# Patient Record
Sex: Male | Born: 1970 | Race: White | Hispanic: No | Marital: Married | Smoking: Never smoker
Health system: Southern US, Community
[De-identification: ages and names within clinical notes are randomized; demographics above are authoritative.]

## PROBLEM LIST (undated history)

## (undated) DIAGNOSIS — G4730 Sleep apnea, unspecified: Secondary | ICD-10-CM

## (undated) DIAGNOSIS — T7840XA Allergy, unspecified, initial encounter: Secondary | ICD-10-CM

## (undated) DIAGNOSIS — K219 Gastro-esophageal reflux disease without esophagitis: Secondary | ICD-10-CM

## (undated) HISTORY — DX: Allergy, unspecified, initial encounter: T78.40XA

## (undated) HISTORY — PX: COLONOSCOPY: SHX174

## (undated) HISTORY — DX: Gastro-esophageal reflux disease without esophagitis: K21.9

## (undated) HISTORY — PX: POLYPECTOMY: SHX149

## (undated) HISTORY — DX: Sleep apnea, unspecified: G47.30

---

## 1989-06-23 HISTORY — PX: SKULL FRACTURE ELEVATION: SHX781

## 2009-03-07 ENCOUNTER — Ambulatory Visit: Payer: Self-pay | Admitting: Internal Medicine

## 2009-03-07 DIAGNOSIS — R9431 Abnormal electrocardiogram [ECG] [EKG]: Secondary | ICD-10-CM

## 2009-03-07 DIAGNOSIS — J309 Allergic rhinitis, unspecified: Secondary | ICD-10-CM | POA: Insufficient documentation

## 2009-03-08 LAB — CONVERTED CEMR LAB
Glucose, Bld: 91 mg/dL (ref 70–99)
LDL Cholesterol: 141 mg/dL — ABNORMAL HIGH (ref 0–99)
Total CHOL/HDL Ratio: 6
VLDL: 23.8 mg/dL (ref 0.0–40.0)

## 2010-03-18 ENCOUNTER — Ambulatory Visit: Payer: Self-pay | Admitting: Internal Medicine

## 2010-03-18 DIAGNOSIS — L723 Sebaceous cyst: Secondary | ICD-10-CM

## 2010-07-23 NOTE — Assessment & Plan Note (Signed)
Summary: MASSES UNDER ARMPIT/DLO   Vital Signs:  Patient profile:   40 year old male Weight:      175 pounds BMI:     25.75 Temp:     98.1 degrees F oral Pulse rate:   76 / minute Pulse rhythm:   regular BP sitting:   112 / 76  (left arm) Cuff size:   regular  Vitals Entered By: Sydell Axon LPN (March 18, 2010 10:07 AM) CC: Check masses under right armpit   History of Present Illness: Has a couple of lumps in right armpit One is higher and sore, one is lower and not sore started about 5-7 days ago  No breast masses or discharge No scratches or injury to right arm Doesn't have a cat  Did change deodorant---did notice the changes after that (only used for a couple of days)  No drainage  Did try warm compress and seemed to give temporary relief  Allergies: No Known Drug Allergies  Past History:  Past medical, surgical, family and social histories (including risk factors) reviewed for relevance to current acute and chronic problems.  Past Medical History: Reviewed history from 03/07/2009 and no changes required. Allergic rhinitis  Past Surgical History: Reviewed history from 03/07/2009 and no changes required. Depressed skull fracture--needed repair  1991 Chest pain admit--stress test negative   ~2005  Family History: Reviewed history from 03/07/2009 and no changes required. Doesn't know Dad's history Mom in good health 1 brother, 2 sisters No CAD, HTN DM in maternal GM No prostate or colon cancer Mat GF with some skin cancers  Social History: Reviewed history from 03/07/2009 and no changes required. 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   Breasts:  no masses, no swelling, no nipple discharge, and no tenderness.   Skin:  2 small  subcutaneous masses in right axilla clearly not nodes no sig inflammation Axillary Nodes:  No palpable lymphadenopathy   Impression & Recommendations:  Problem # 1:  SEBACEOUS CYST (ICD-706.2) Assessment New from sweat gland probaby due to brief use of stick deodorant discussed warm compresses  Rx for keflex in case they become inflamed  Complete Medication List: 1)  Zyrtec Allergy 10 Mg Tbdp (Cetirizine hcl) .... Take one by mouth daily as needed 2)  Cephalexin 500 Mg Tabs (Cephalexin) .Marland Kitchen.. 1 tab by mouth three times a day for infection under arm  Patient Instructions: 1)  Please schedule a follow-up appointment as needed .  Prescriptions: CEPHALEXIN 500 MG TABS (CEPHALEXIN) 1 tab by mouth three times a day for infection under arm  #30 x 0   Entered and Authorized by:   Cindee Salt MD   Signed by:   Cindee Salt MD on 03/18/2010   Method used:   Print then Give to Patient   RxID:   [id number]   Current Allergies (reviewed today): No known allergies

## 2011-01-24 ENCOUNTER — Ambulatory Visit (INDEPENDENT_AMBULATORY_CARE_PROVIDER_SITE_OTHER): Payer: Self-pay | Admitting: Family Medicine

## 2011-01-24 ENCOUNTER — Encounter: Payer: Self-pay | Admitting: Family Medicine

## 2011-01-24 VITALS — BP 120/80 | HR 61 | Temp 98.3°F | Wt 183.2 lb

## 2011-01-24 DIAGNOSIS — M549 Dorsalgia, unspecified: Secondary | ICD-10-CM | POA: Insufficient documentation

## 2011-01-24 MED ORDER — CYCLOBENZAPRINE HCL 10 MG PO TABS: 5.0000 mg | ORAL_TABLET | Freq: Three times a day (TID) | ORAL | Status: AC | PRN

## 2011-01-24 NOTE — Progress Notes (Signed)
Back pain.  Sharp pain in L back after standing last night.  Still with pain this AM.  He gets a sharp pain episodically.  No trauma/fall.  He was doing a lot of work this past week, but no sig change until last night.  "Stabbing/shooting pain".  Uncomfortable last night.  No FCNAVD.  No leg sx, no radicular sx.  No leg weakness.    Meds, vitals, and allergies reviewed.   ROS: See HPI.  Otherwise, noncontributory.  nad ncat Back w/o midline pain. L paraspinal muscles ttp, pain with facet loading.  Distally nv intact w/o radicular sx.

## 2011-01-24 NOTE — Patient Instructions (Signed)
Use the flexeril up to 3 times a day.  It can make you drowsy.  Take ibuprofen 200mg  tabs, 3 tabs 3-4 times a day with food.  Gently stretch your back and use a heating pad.  Take care.

## 2011-01-25 NOTE — Assessment & Plan Note (Signed)
Likely facet irritation and/or muscle spasm.  D/w pt about gentle stretching, flexeril with sedation caution and NSAIDS with GI caution.  No indication to image, d/w pt.  F/u prn.  No red flags on exam or history.

## 2012-03-22 ENCOUNTER — Ambulatory Visit (INDEPENDENT_AMBULATORY_CARE_PROVIDER_SITE_OTHER): Payer: BC Managed Care – PPO | Admitting: Internal Medicine

## 2012-03-22 ENCOUNTER — Encounter: Payer: Self-pay | Admitting: Internal Medicine

## 2012-03-22 VITALS — BP 118/70 | HR 56 | Temp 98.2°F | Ht 69.0 in | Wt 171.0 lb

## 2012-03-22 DIAGNOSIS — R5383 Other fatigue: Secondary | ICD-10-CM

## 2012-03-22 DIAGNOSIS — Z23 Encounter for immunization: Secondary | ICD-10-CM

## 2012-03-22 DIAGNOSIS — Z0000 Encounter for general adult medical examination without abnormal findings: Secondary | ICD-10-CM | POA: Insufficient documentation

## 2012-03-22 DIAGNOSIS — R5381 Other malaise: Secondary | ICD-10-CM

## 2012-03-22 LAB — HEPATIC FUNCTION PANEL
ALT: 23 U/L (ref 0–53)
Alkaline Phosphatase: 67 U/L (ref 39–117)
Bilirubin, Direct: 0.1 mg/dL (ref 0.0–0.3)
Total Bilirubin: 1 mg/dL (ref 0.3–1.2)
Total Protein: 6.9 g/dL (ref 6.0–8.3)

## 2012-03-22 LAB — BASIC METABOLIC PANEL
Calcium: 9.2 mg/dL (ref 8.4–10.5)
GFR: 76.59 mL/min (ref >60.00)
Sodium: 139 mEq/L (ref 135–145)

## 2012-03-22 LAB — CBC WITH DIFFERENTIAL/PLATELET
Basophils Relative: 0.4 % (ref 0.0–3.0)
Hemoglobin: 15.3 g/dL (ref 13.0–17.0)
Lymphocytes Relative: 29.7 % (ref 12.0–46.0)
Monocytes Relative: 4.2 % (ref 3.0–12.0)
Neutro Abs: 3.5 10*3/uL (ref 1.4–7.7)
RBC: 5.09 Mil/uL (ref 4.22–5.81)
WBC: 5.5 10*3/uL (ref 4.5–10.5)

## 2012-03-22 LAB — SEDIMENTATION RATE: Sed Rate: 4 mm/hr (ref 0–22)

## 2012-03-22 NOTE — Assessment & Plan Note (Signed)
Healthy Counseling done 

## 2012-03-22 NOTE — Progress Notes (Signed)
Subjective:    Patient ID: Miguel Jackson, male    DOB: 1970/12/05, 41 y.o.   MRN: [id number]  HPI Here for physical  Concerned about diabetes-- feels tired more Goes back a year or more Notes "crash" about 1 hour after eating Nods off easily Feels that he sleeps okay Does snore some but no reports of gasping or apnea Usually awakens refreshed---before alarm goes off Better if he is active Tries bike regularly---mountain biking. Runs with daughter at times  No current outpatient prescriptions on file prior to visit.    No Known Allergies  Past Medical History  Diagnosis Date  . Allergy     Past Surgical History  Procedure Date  . Skull fracture elevation 1991    depressed, needed repair    Family History  Problem Relation Age of Onset  . Diabetes Maternal Grandmother   . Cancer Maternal Grandfather     skin    History   Social History  . Marital Status: Married    Spouse Name: N/A    Number of Children: 2  . Years of Education: N/A   Occupational History  . IT     [employer]   Social History Main Topics  . Smoking status: Never Smoker   . Smokeless tobacco: Never Used  . Alcohol Use: Yes  . Drug Use: Not on file  . Sexually Active: Not on file   Other Topics Concern  . Not on file   Social History Narrative       Review of Systems  Constitutional: Positive for fatigue. Negative for unexpected weight change.       Wears seat belt  HENT: Positive for congestion and rhinorrhea. Negative for hearing loss, dental problem and tinnitus.        Uses cetirizine prn Due for dental appt  Eyes: Negative for visual disturbance.       No diplopia or unilateral vision loss  Respiratory: Negative for cough, chest tightness and shortness of breath.   Cardiovascular: Negative for chest pain, palpitations and leg swelling.  Gastrointestinal: Negative for nausea, vomiting, abdominal pain, constipation and blood in stool.       No heartburn    Genitourinary: Negative for urgency, frequency and difficulty urinating.       Rare dribbling ---seems positional No sexual problems  Musculoskeletal: Negative for back pain, joint swelling and arthralgias.  Skin: Negative for rash.       No suspicious areas  Neurological: Positive for headaches. Negative for dizziness, syncope, weakness, light-headedness and numbness.       Occ headaches with changes in barometric pressure---occ uses tylenol or ibuprofen  Hematological: Negative for adenopathy. Does not bruise/bleed easily.  Psychiatric/Behavioral: Negative for disturbed wake/sleep cycle and dysphoric mood. The patient is not nervous/anxious.        Objective:   Physical Exam  Constitutional: He is oriented to person, place, and time. He appears well-developed and well-nourished. No distress.  HENT:  Head: Normocephalic and atraumatic.  Right Ear: External ear normal.  Left Ear: External ear normal.  Mouth/Throat: Oropharynx is clear and moist. No oropharyngeal exudate.  Eyes: Conjunctivae normal and EOM are normal. Pupils are equal, round, and reactive to light.  Neck: Normal range of motion. Neck supple. No thyromegaly present.  Cardiovascular: Normal rate, regular rhythm, normal heart sounds and intact distal pulses.  Exam reveals no gallop.   No murmur heard. Pulmonary/Chest: Effort normal and breath sounds normal. No respiratory distress. He has no wheezes. He  has no rales.  Abdominal: Soft. Bowel sounds are normal. He exhibits no distension. There is no tenderness. There is no rebound.  Musculoskeletal: Normal range of motion. He exhibits no edema and no tenderness.  Lymphadenopathy:    He has no cervical adenopathy.    He has no axillary adenopathy.       Right: No inguinal, no supraclavicular and no epitrochlear adenopathy present.       Left: No inguinal, no supraclavicular and no epitrochlear adenopathy present.  Neurological: He is alert and oriented to person, place,  and time.  Skin: No rash noted. No erythema.  Psychiatric: He has a normal mood and affect. His behavior is normal. Thought content normal.          Assessment & Plan:

## 2012-03-22 NOTE — Assessment & Plan Note (Signed)
Vague feeling of "crash" some time post prandial  No findings on PE and no other worrisome findings from history Will check labs Doubt he has serious pathology

## 2012-03-22 NOTE — Addendum Note (Signed)
Addended by: Sueanne Margarita on: 03/22/2012 12:36 PM   Modules accepted: Orders

## 2012-09-13 ENCOUNTER — Encounter: Payer: Self-pay | Admitting: Family Medicine

## 2012-09-13 ENCOUNTER — Ambulatory Visit (INDEPENDENT_AMBULATORY_CARE_PROVIDER_SITE_OTHER)
Admission: RE | Admit: 2012-09-13 | Discharge: 2012-09-13 | Disposition: A | Discharge status: Home / Self Care | Payer: Worker's Compensation | Source: Ambulatory Visit | Attending: Family Medicine | Admitting: Family Medicine

## 2012-09-13 ENCOUNTER — Ambulatory Visit (INDEPENDENT_AMBULATORY_CARE_PROVIDER_SITE_OTHER): Payer: Worker's Compensation | Admitting: Family Medicine

## 2012-09-13 ENCOUNTER — Ambulatory Visit: Admission: RE | Admit: 2012-09-13 | Payer: Self-pay | Source: Ambulatory Visit

## 2012-09-13 ENCOUNTER — Ambulatory Visit: Payer: Self-pay

## 2012-09-13 VITALS — BP 138/88 | HR 66 | Temp 98.2°F | Wt 186.2 lb

## 2012-09-13 DIAGNOSIS — M25529 Pain in unspecified elbow: Secondary | ICD-10-CM

## 2012-09-13 DIAGNOSIS — M7712 Lateral epicondylitis, left elbow: Secondary | ICD-10-CM

## 2012-09-13 DIAGNOSIS — M25522 Pain in left elbow: Secondary | ICD-10-CM

## 2012-09-13 DIAGNOSIS — M7710 Lateral epicondylitis, unspecified elbow: Secondary | ICD-10-CM

## 2012-09-13 NOTE — Progress Notes (Signed)
Nature conservation officer at [facility] [address] [place] [address] Phone: [phone] Fax: [phone]  Date:  09/13/2012   Name:  Miguel Jackson   DOB:  [birth year]   MRN:  [id number] Gender: male Age: 42 y.o.  Primary Physician:  Tillman Abide, MD  Evaluating MD: Hannah Beat, MD   Chief Complaint: L elbow pain   History of Present Illness:  Miguel Jackson is a 42 y.o. pleasant patient who presents with the following:  Was at work 1 month ago, and was working on a piece of gear, and hit his L elbow pretty hard on the latch of the door. Thought might leave a bruise. Was about a month ago, and was having some shooting pains. Has had a fair amount continued pain. Has some constant dull feeling in his hand. Pain with supination and extension at the wrist on the L.  Flex hand, will felt some pain and discomfort in the area of trauma.  RHD.  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   Patient Active Problem List  Diagnosis  . ALLERGIC RHINITIS  . Routine general medical examination at a health care facility  . Fatigue    Past Medical History  Diagnosis Date  . Allergy     Past Surgical History  Procedure Laterality Date  . Skull fracture elevation  1991    depressed, needed repair    History   Social History  . Marital Status: Married    Spouse Name: N/A    Number of Children: 2  . Years of Education: N/A   Occupational History  . IT     [employer]   Social History Main Topics  . Smoking status: Never Smoker   . Smokeless tobacco: Never Used  . Alcohol Use: Yes  . Drug Use: Not on file  . Sexually Active: Not on file   Other Topics Concern  . Not on file   Social History Narrative        Family History  Problem Relation Age of Onset  . Diabetes Maternal Grandmother   . Cancer Maternal Grandfather     skin    No Known Allergies  Medication list has been reviewed and updated.  No outpatient prescriptions  prior to visit.   No facility-administered medications prior to visit.    Review of Systems:   GEN: No fevers, chills. Nontoxic. Primarily MSK c/o today. MSK: Detailed in the HPI GI: tolerating PO intake without difficulty Neuro: No numbness, parasthesias, or tingling associated. Otherwise the pertinent positives of the ROS are noted above.    Physical Examination: BP 138/88  Pulse 66  Temp(Src) 98.2 F (36.8 C) (Oral)  Wt 186 lb 4 oz (84.482 kg)  BMI 27.49 kg/m2  SpO2 97%  Ideal Body Weight:     GEN: WDWN, NAD, Non-toxic, Alert & Oriented x 3 HEENT: Atraumatic, Normocephalic.  Ears and Nose: No external deformity. EXTR: No clubbing/cyanosis/edema NEURO: Normal gait.  PSYCH: Normally interactive. Conversant. Not depressed or anxious appearing.  Calm demeanor.   Elbow: LEFT Ecchymosis or edema: neg ROM: EXT: lacks 2 degrees Flexion: lacks 10 deg, painful terminally Supination and pronation: terminal motion normal Shoulder ROM: Full Flexion: 5/5 Extension: 5/5, PAINFUL Supination: 5/5, PAINFUL Pronation: 5/5 Wrist ext: 5/5 Wrist flexion: 5/5 No gross bony abnormality Varus and Valgus stress: stable ECRB tenderness: YES, TTP Medial epicondyle: NT Lateral epicondyle, resisted wrist extension from wrist full pronation and flexion: PAINFUL grip: 5/5  sensation intact Tinel's,  Elbow: negative   Dg Elbow Complete Left  09/13/2012  *RADIOLOGY REPORT*  Clinical Data: Left elbow pain.  LEFT ELBOW - COMPLETE 3+ VIEW  Comparison: None.  Findings: No acute bony abnormality.  Specifically, no fracture, subluxation, or dislocation.  Soft tissues are intact. Joint spaces are maintained.  Normal bone mineralization.  No joint effusion.  IMPRESSION: No acute bony abnormality.   Original Report Authenticated By: Charlett Nose, M.D.     Assessment and Plan:  Left elbow pain - Plan: DG Elbow Complete Left, Ambulatory referral to Physical Therapy  Tennis elbow syndrome, left -  Plan: Ambulatory referral to Physical Therapy  Traumatic lateral epicondylitis on the LEFT Probable traumatic irritation of the LEFT radial nerve Loss of motion at the elbow - likely from guarding. Normal xrays. Seems unlikely but remotely possible that a fracture occurred at time of injury 1 month ago.  PT to help resume normal ROM, which is critically important, and use ATYMM  Or GRASHEY technique. Iontophoresis at ECRB, also.   Recheck 5-6 weeks.  Orders Today:  Orders Placed This Encounter  Procedures  . DG Elbow Complete Left    Standing Status: Future     Number of Occurrences: 1     Standing Expiration Date: 11/13/2013    Order Specific Question:  Preferred imaging location?    Answer:  [facility]    Order Specific Question:  Reason for exam:    Answer:  trauma left elbow, 1 month ago  . Ambulatory referral to Physical Therapy    Referral Priority:  Routine    Referral Type:  Physical Medicine    Referral Reason:  Specialty Services Required    Requested Specialty:  Physical Therapy    Number of Visits Requested:  1    Updated Medication List: (Includes new medications, updates to list, dose adjustments) No orders of the defined types were placed in this encounter.    Medications Discontinued: There are no discontinued medications.    Signed, Elpidio Galea. Vernell Back, MD 09/13/2012 8:46 AM

## 2012-09-13 NOTE — Patient Instructions (Signed)
F/u 5-6 weeks

## 2013-04-28 ENCOUNTER — Other Ambulatory Visit: Payer: Self-pay

## 2013-11-29 ENCOUNTER — Ambulatory Visit (INDEPENDENT_AMBULATORY_CARE_PROVIDER_SITE_OTHER): Payer: BC Managed Care – PPO | Admitting: Family Medicine

## 2013-11-29 ENCOUNTER — Encounter: Payer: Self-pay | Admitting: Family Medicine

## 2013-11-29 VITALS — BP 120/70 | HR 68 | Temp 98.3°F | Ht 68.5 in | Wt 184.5 lb

## 2013-11-29 DIAGNOSIS — R000 Tachycardia, unspecified: Secondary | ICD-10-CM

## 2013-11-29 NOTE — Progress Notes (Signed)
   [place] [address] Phone: [phone] Fax: [phone]  Patient ID: Miguel Jackson MRN: [id number], DOB: 07/03/1970, 43 y.o. Date of Encounter: 11/29/2013  Primary Physician:  Viviana Simpler, MD   Chief Complaint: Heart Murmur   Subjective:   History of Present Illness:  Miguel Jackson is a 43 y.o. very pleasant male patient who presents with the following:  10 years ago and went to an EKG, and thought was abnormal and had a bunch of tests. Cardiologists and went through a bunch of tests.   Stress test and imaging, but EKG was normal. Since then, no problems. Had an EKG about 10 years ago.   [employer] a lot. Over the last few weeks, and was sitting in a chair, and could hear what sounded and was not in time with his breathing. In tune with his heartbeat. Has kept his eyes on it, and sometimes on bed. Hear a whooshing or funny sound.   Past Medical History, Surgical History, Social History, Family History, Problem List, Medications, and Allergies have been reviewed and updated if relevant.  Review of Systems:  GEN: No acute illnesses, no fevers, chills. GI: No n/v/d, eating normally Pulm: No SOB Interactive and getting along well at home.  Otherwise, ROS is as per the HPI.  Objective:   Physical Examination: BP 120/70  Pulse 68  Temp(Src) 98.3 F (36.8 C) (Oral)  Ht 5' 8.5" (1.74 m)  Wt 184 lb 8 oz (83.689 kg)  BMI 27.64 kg/m2  SpO2 96%   GEN: WDWN, NAD, Non-toxic, A & O x 3 HEENT: Atraumatic, Normocephalic. Neck supple. No masses, No LAD. Ears and Nose: No external deformity. CV: RRR, No M/G/R. No JVD. No thrill. No extra heart sounds. PULM: CTA B, no wheezes, crackles, rhonchi. No retractions. No resp. distress. No accessory muscle use. EXTR: No c/c/e NEURO Normal gait.  PSYCH: Normally interactive. Conversant. Not depressed or anxious appearing.  Calm demeanor.   Laboratory and Imaging Data:  Assessment & Plan:    Tachycardia - Plan: EKG 12-Lead  I think there is no murmur. EKG normal. Reassured.  EKG: Normal sinus rhythm. Normal axis, normal R wave progression, No acute ST elevation or depression.   New Prescriptions   No medications on file   Modified Medications   No medications on file   Orders Placed This Encounter  Procedures  . EKG 12-Lead   Follow-up: No Follow-up on file. Unless noted above, the patient is to follow-up if symptoms worsen. Red flags were reviewed with the patient.  Signed,  Maud Deed. Nesanel Aguila, MD, CAQ Sports Medicine   Discontinued Medications   No medications on file   Current Medications at Discharge:   Medication List    Notice As of 11/29/2013  5:14 PM   You have not been prescribed any medications.

## 2013-11-29 NOTE — Progress Notes (Signed)
Pre visit review using our clinic review tool, if applicable. No additional management support is needed unless otherwise documented below in the visit note. 

## 2016-02-05 ENCOUNTER — Ambulatory Visit
Admission: RE | Admit: 2016-02-05 | Discharge: 2016-02-05 | Disposition: A | Discharge status: Home / Self Care | Payer: BLUE CROSS/BLUE SHIELD | Source: Ambulatory Visit | Attending: Primary Care | Admitting: Primary Care

## 2016-02-05 ENCOUNTER — Ambulatory Visit (INDEPENDENT_AMBULATORY_CARE_PROVIDER_SITE_OTHER): Payer: BLUE CROSS/BLUE SHIELD | Admitting: Primary Care

## 2016-02-05 ENCOUNTER — Other Ambulatory Visit: Payer: Self-pay | Admitting: Primary Care

## 2016-02-05 ENCOUNTER — Encounter: Payer: Self-pay | Admitting: Primary Care

## 2016-02-05 VITALS — BP 118/76 | HR 63 | Temp 98.1°F | Ht 68.5 in | Wt 188.0 lb

## 2016-02-05 DIAGNOSIS — R2241 Localized swelling, mass and lump, right lower limb: Secondary | ICD-10-CM | POA: Insufficient documentation

## 2016-02-05 NOTE — Patient Instructions (Signed)
Stop by the front desk and speak with either Rosaria Ferries or Ebony Hail regarding your Ultrasound.   Try applying heat to the side of discomfort. You may also try taking ibuprofen 600 mg three times daily as needed for discomfort.  I will be in touch with you soon regarding your results.  It was a pleasure meeting you!

## 2016-02-05 NOTE — Progress Notes (Signed)
Pre visit review using our clinic review tool, if applicable. No additional management support is needed unless otherwise documented below in the visit note. 

## 2016-02-05 NOTE — Progress Notes (Signed)
   Subjective:    Patient ID: Miguel Jackson, male    DOB: 21-Apr-1971, 45 y.o.   MRN: [id number]  HPI  Mr. Busto is a 45 year old male who presents today with a chief complaint of leg massAnd pain. The pain is located to the right lateral calf and has been present for the past 3-4 weeks. He developed soreness to his calf 3-4 weeks ago without evidence of a bruise. Since then the soreness has remained and has developed a hardened mass to the same area. He's not taken anything OTC for his symptoms. Overall the sensation is no better or worse. Denies recent long travel, swelling, trauma, palpitations, shortness of breath, redness, he is a non smoker.  Review of Systems  Constitutional: Negative for fever.  Respiratory: Negative for shortness of breath.   Cardiovascular: Negative for palpitations.  Skin: Negative for color change.       Mass and tenderness to right lateral calf       Past Medical History:  Diagnosis Date  . Allergy      Social History   Social History  . Marital status: Married    Spouse name: N/A  . Number of children: 2  . 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 Smoking status: Never Smoker  . Smokeless tobacco: Never Used  . Alcohol use Yes  . Drug use: No  . Sexual activity: Not on file   Other Topics Concern  . Not on file   Social History Narrative        Past Surgical History:  Procedure Laterality Date  . SKULL FRACTURE ELEVATION  1991   depressed, needed repair    Family History  Problem Relation Age of Onset  . Diabetes Maternal Grandmother   . Cancer Maternal Grandfather     skin    No Known Allergies  No current outpatient prescriptions on file prior to visit.   No current facility-administered medications on file prior to visit.     BP 118/76   Pulse 63   Temp 98.1 F (36.7 C) (Oral)   Ht 5' 8.5" (1.74 m)   Wt 188 lb (85.3 kg)   SpO2 98%   BMI  28.17 kg/m    Objective:   Physical Exam  Constitutional: He appears well-nourished.  Cardiovascular: Normal rate and regular rhythm.   Negative Homans sign.  Pulmonary/Chest: Effort normal and breath sounds normal.  Skin: Skin is warm and dry. No erythema.  No swelling present. Calf sizes equal bilaterally. Slight raised hardened mass to right lateral calf at site of discomfort. No erythema. Tender.          Assessment & Plan:  Lower extremity pain:  Also with mass, located to right lateral calf for the past 3-4 weeks. Exam today without evidence of erythema, swelling. Hardened mass located to area of concern. Does not feel to be sebaceous or ganglion cyst. Could be phlebitis, however denies trauma or recent injury. Overall low risk for DVT formation, however given presentation and discomfort will rule out DVT with ultrasound. Exam otherwise unremarkable. Will have him apply heat 3 times daily and use ibuprofen as needed for discomfort. Will await results.  Sheral Flow, NP

## 2016-02-06 ENCOUNTER — Encounter: Payer: Self-pay | Admitting: Primary Care

## 2016-02-06 ENCOUNTER — Telehealth: Payer: Self-pay | Admitting: Primary Care

## 2016-02-06 NOTE — Telephone Encounter (Signed)
Noted. Patient viewed results and Kate's comments on MyChart.  

## 2016-02-06 NOTE — Telephone Encounter (Signed)
Patient called to get the results of his test done yesterday.  Please call patient.

## 2016-02-06 NOTE — Telephone Encounter (Signed)
Please ensure patient has reviewed his my chart message.

## 2016-03-03 ENCOUNTER — Telehealth: Payer: Self-pay | Admitting: Primary Care

## 2016-03-03 NOTE — Telephone Encounter (Signed)
-----   Message from Pleas Koch, NP sent at 02/06/2016 11:02 AM EDT ----- Regarding: Leg mass/pain Check on Mr. Henk's leg. Any pain or mass?

## 2016-03-03 NOTE — Telephone Encounter (Addendum)
Per DPR, left a detail message left for patient to return my call.

## 2016-03-05 NOTE — Telephone Encounter (Signed)
Sent patient a MyChart message

## 2016-03-06 NOTE — Telephone Encounter (Signed)
Noted. Please notify patient we are glad to hear that this is better.

## 2016-06-26 ENCOUNTER — Encounter: Payer: Self-pay | Admitting: Family Medicine

## 2016-06-26 ENCOUNTER — Ambulatory Visit (INDEPENDENT_AMBULATORY_CARE_PROVIDER_SITE_OTHER): Payer: BLUE CROSS/BLUE SHIELD | Admitting: Family Medicine

## 2016-06-26 VITALS — BP 114/70 | HR 68 | Temp 98.3°F | Ht 68.5 in | Wt 189.5 lb

## 2016-06-26 DIAGNOSIS — R1011 Right upper quadrant pain: Secondary | ICD-10-CM

## 2016-06-26 LAB — CBC WITH DIFFERENTIAL/PLATELET
Basophils Absolute: 0 10*3/uL (ref 0.0–0.1)
Basophils Relative: 0.3 % (ref 0.0–3.0)
Eosinophils Absolute: 0.2 10*3/uL (ref 0.0–0.7)
Eosinophils Relative: 3.1 % (ref 0.0–5.0)
HCT: 45.2 % (ref 39.0–52.0)
Hemoglobin: 15.6 g/dL (ref 13.0–17.0)
Lymphocytes Relative: 36.2 % (ref 12.0–46.0)
Lymphs Abs: 2.4 10*3/uL (ref 0.7–4.0)
MCHC: 34.6 g/dL (ref 30.0–36.0)
MCV: 85 fl (ref 78.0–100.0)
Monocytes Absolute: 0.4 10*3/uL (ref 0.1–1.0)
Monocytes Relative: 6.3 % (ref 3.0–12.0)
Neutro Abs: 3.5 10*3/uL (ref 1.4–7.7)
Neutrophils Relative %: 54.1 % (ref 43.0–77.0)
Platelets: 253 10*3/uL (ref 150.0–400.0)
RBC: 5.32 Mil/uL (ref 4.22–5.81)
RDW: 12.3 % (ref 11.5–15.5)
WBC: 6.5 10*3/uL (ref 4.0–10.5)

## 2016-06-26 LAB — BASIC METABOLIC PANEL
BUN: 20 mg/dL (ref 6–23)
CALCIUM: 9.4 mg/dL (ref 8.4–10.5)
CO2: 35 mEq/L — ABNORMAL HIGH (ref 19–32)
Chloride: 102 mEq/L (ref 96–112)
Creatinine, Ser: 1.23 mg/dL (ref 0.40–1.50)
GFR: 66.76 mL/min (ref >60.00)
GLUCOSE: 91 mg/dL (ref 70–99)
POTASSIUM: 3.9 meq/L (ref 3.5–5.1)
Sodium: 141 mEq/L (ref 135–145)

## 2016-06-26 LAB — HEPATIC FUNCTION PANEL
ALT: 53 U/L (ref 0–53)
AST: 26 U/L (ref 0–37)
Albumin: 4.6 g/dL (ref 3.5–5.2)
Alkaline Phosphatase: 80 U/L (ref 39–117)
Bilirubin, Direct: 0.1 mg/dL (ref 0.0–0.3)
Total Bilirubin: 0.4 mg/dL (ref 0.2–1.2)
Total Protein: 7.2 g/dL (ref 6.0–8.3)

## 2016-06-26 LAB — H. PYLORI ANTIBODY, IGG: H Pylori IgG: NEGATIVE

## 2016-06-26 LAB — LIPASE: LIPASE: 31 U/L (ref 11.0–59.0)

## 2016-06-26 NOTE — Progress Notes (Signed)
Pre visit review using our clinic review tool, if applicable. No additional management support is needed unless otherwise documented below in the visit note. 

## 2016-06-26 NOTE — Patient Instructions (Signed)

## 2016-06-26 NOTE — Progress Notes (Signed)
Dr. Frederico Hamman T. Markelle Najarian, MD, [facility] [place], [address] Phone: [phone] Fax: [phone]  06/26/2016  Patient: Miguel Jackson, MRN: [id number], DOB: [birth year], 46 y.o.  Primary Physician:  Viviana Simpler, MD   Chief Complaint  Patient presents with  . Abdominal Pain   Subjective:   Miguel Jackson is a 46 y.o. very pleasant male patient who presents with the following:  Several times in the last few months. Had an episode that lasted for several hours. Not sure about certain foods. ? Dense bread. Chest pain, along with RUQ pain - lasting hours. No n/v/d. No BRBPR. O/w he is healthy.   No prior abd surgeries.  Tums has not helped.   Past Medical History, Surgical History, Social History, Family History, Problem List, Medications, and Allergies have been reviewed and updated if relevant.  Patient Active Problem List   Diagnosis Date Noted  . Routine general medical examination at a health care facility 03/22/2012  . Fatigue 03/22/2012  . ALLERGIC RHINITIS 03/07/2009    Past Medical History:  Diagnosis Date  . Allergy     Past Surgical History:  Procedure Laterality Date  . SKULL FRACTURE ELEVATION  1991   depressed, needed repair    Social History   Social History  . Marital status: Married    Spouse name: N/A  . Number of children: 2  . 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 Smoking status: Never Smoker  . Smokeless tobacco: Never Used  . Alcohol use Yes  . Drug use: No  . Sexual activity: Not on file   Other Topics Concern  . Not on file   Social History Narrative        Family History  Problem Relation Age of Onset  . Diabetes Maternal Grandmother   . Cancer Maternal Grandfather     skin    No Known Allergies  Medication list reviewed and updated in full in [place].   GEN: No  acute illnesses, no fevers, chills. GI: No n/v/d, eating normally Pulm: No SOB Interactive and getting along well at home.  Otherwise, ROS is as per the HPI.  Objective:   BP 114/70   Pulse 68   Temp 98.3 F (36.8 C) (Oral)   Ht 5' 8.5" (1.74 m)   Wt 189 lb 8 oz (86 kg)   BMI 28.39 kg/m   GEN: WDWN, NAD, Non-toxic, A & O x 3 HEENT: Atraumatic, Normocephalic. Neck supple. No masses, No LAD. Ears and Nose: No external deformity. CV: RRR, No M/G/R. No JVD. No thrill. No extra heart sounds. PULM: CTA B, no wheezes, crackles, rhonchi. No retractions. No resp. distress. No accessory muscle use. ABD: S,mild RUQ tenderness, ND, + BS, No rebound, No HSM  EXTR: No c/c/e NEURO Normal gait.  PSYCH: Normally interactive. Conversant. Not depressed or anxious appearing.  Calm demeanor.   Laboratory and Imaging Data:  Assessment and Plan:   Abdominal pain, right upper quadrant - Plan: Basic metabolic panel, CBC with Differential/Platelet, Hepatic function panel, Lipase, H. pylori antibody, IgG, US Abdomen Limited RUQ  Basic abd pain lab work-up.  Check RUQ [place] to evaluate for potential gallstones or chronic cholecystitis, or other GB disease.   Follow-up: No Follow-up on file.  Orders Placed This Encounter  Procedures  . US Abdomen Limited RUQ  .  Basic metabolic panel  . CBC with Differential/Platelet  . Hepatic function panel  . Lipase  . H. pylori antibody, IgG    Signed,  Leonid Manus T. Lendell Gallick, MD   Allergies as of 06/26/2016   No Known Allergies     Medication List    as of 06/26/2016  3:00 PM   You have not been prescribed any medications.

## 2016-06-27 ENCOUNTER — Ambulatory Visit
Admission: RE | Admit: 2016-06-27 | Discharge: 2016-06-27 | Disposition: A | Discharge status: Home / Self Care | Payer: BLUE CROSS/BLUE SHIELD | Source: Ambulatory Visit | Attending: Family Medicine | Admitting: Family Medicine

## 2016-06-27 DIAGNOSIS — R1011 Right upper quadrant pain: Secondary | ICD-10-CM | POA: Diagnosis not present

## 2016-07-01 ENCOUNTER — Ambulatory Visit (INDEPENDENT_AMBULATORY_CARE_PROVIDER_SITE_OTHER): Payer: BLUE CROSS/BLUE SHIELD | Admitting: Podiatry

## 2016-07-01 ENCOUNTER — Encounter: Payer: Self-pay | Admitting: Podiatry

## 2016-07-01 ENCOUNTER — Ambulatory Visit (INDEPENDENT_AMBULATORY_CARE_PROVIDER_SITE_OTHER): Payer: BLUE CROSS/BLUE SHIELD

## 2016-07-01 VITALS — BP 137/89 | HR 67

## 2016-07-01 DIAGNOSIS — L299 Pruritus, unspecified: Secondary | ICD-10-CM | POA: Diagnosis not present

## 2016-07-01 DIAGNOSIS — M722 Plantar fascial fibromatosis: Secondary | ICD-10-CM

## 2016-07-01 DIAGNOSIS — B353 Tinea pedis: Secondary | ICD-10-CM | POA: Diagnosis not present

## 2016-07-01 DIAGNOSIS — M79671 Pain in right foot: Secondary | ICD-10-CM

## 2016-07-01 MED ORDER — CLOTRIMAZOLE-BETAMETHASONE 1-0.05 % EX CREA: 1.0000 "application " | TOPICAL_CREAM | Freq: Two times a day (BID) | CUTANEOUS | 1 refills | Status: DC

## 2016-07-01 MED ORDER — TERBINAFINE HCL 250 MG PO TABS: 250.0000 mg | ORAL_TABLET | Freq: Every day | ORAL | 0 refills | Status: DC

## 2016-07-01 MED ORDER — MELOXICAM 15 MG PO TABS: 15.0000 mg | ORAL_TABLET | Freq: Every day | ORAL | 1 refills | Status: AC

## 2016-07-06 MED ORDER — BETAMETHASONE SOD PHOS & ACET 6 (3-3) MG/ML IJ SUSP: 3.0000 mg | Freq: Once | INTRAMUSCULAR | Status: DC

## 2016-07-06 NOTE — Progress Notes (Signed)
Subjective: Patient presents today for pain and tenderness in the right foot. Patient states the foot pain has been hurting for several weeks now. Patient states that it hurts in the mornings with the first steps out of bed. Patient also complains of itching and dermatitis to the weightbearing surface of the right foot. Patient states there are no alleviating factors itches on occasion. Patient presents today for further treatment and evaluation  Objective: Physical Exam General: The patient is alert and oriented x3 in no acute distress.  Dermatology: Diffuse hyperkeratotic, flaking skin noted with pruritus to the weightbearing surface of the right foot. Skin is warm, dry and supple bilateral lower extremities. Negative for open lesions or macerations bilateral.   Vascular: Dorsalis Pedis and Posterior Tibial pulses palpable bilateral.  Capillary fill time is immediate to all digits.  Neurological: Epicritic and protective threshold intact bilateral.   Musculoskeletal: Tenderness to palpation at the medial calcaneal tubercale and through the insertion of the plantar fascia of the right foot. All other joints range of motion within normal limits bilateral. Strength 5/5 in all groups bilateral.   Radiographic exam: Normal osseous mineralization. Joint spaces preserved. No fracture/dislocation/boney destruction. Calcaneal spur present with mild thickening of plantar fascia right. No other soft tissue abnormalities or radiopaque foreign bodies.   Assessment: 1. Plantar fasciitis right 2. Pain in right foot 3. Tinea pedis right  Plan of Care:  1. Patient evaluated. Xrays reviewed.   2. Injection of 0.5cc Celestone soluspan injected into the right heel at the insertion of the plantar fascia.  3. Instructed patient regarding therapies and modalities at home to alleviate symptoms.  4. Rx for Meloxicam 15mg  PO dispensed   5. Today the patient was scanned for custom molded orthotics 6. Plantar  fascial band(s) dispensed. 7. Prescription for terbinafine 250 mg #28  8. Prescription for Lotrisone cream  9. Return to clinic in 4 weeks.     Edrick Kins, DPM [facility]  Dr. Edrick Kins, [address]                                        [place], [place] [address]                Office [phone]  Fax [phone]

## 2016-07-18 ENCOUNTER — Ambulatory Visit (INDEPENDENT_AMBULATORY_CARE_PROVIDER_SITE_OTHER): Payer: Self-pay | Admitting: Podiatry

## 2016-07-18 DIAGNOSIS — M722 Plantar fascial fibromatosis: Secondary | ICD-10-CM

## 2016-07-18 NOTE — Patient Instructions (Signed)

## 2016-07-18 NOTE — Progress Notes (Signed)
Patient ID: Miguel Jackson, male   DOB: October 05, 1970, 46 y.o.   MRN: [id number] Patient presents for orthotic pick up.  Verbal and written break in and wear instructions given.  Patient will follow up in 4 weeks if symptoms worsen or fail to improve.

## 2016-08-15 ENCOUNTER — Ambulatory Visit: Payer: BLUE CROSS/BLUE SHIELD | Admitting: Podiatry

## 2017-06-23 HISTORY — PX: WISDOM TOOTH EXTRACTION: SHX21

## 2018-06-25 IMAGING — US US EXTREM LOW*R* LIMITED
1 series · 13 of 13 positions shown · non-contrast
Comparison: None

CLINICAL DATA: Right lateral calf mass 4 weeks with pain

EXAM:
ULTRASOUND right LOWER EXTREMITY LIMITED
TECHNIQUE: Ultrasound examination of the lower extremity soft tissues was
performed in the area of clinical concern.

[Series 1: us extrem low*right* limited · 0.07mm/px · 13 acquisitions, 13 frames shown]
[im 1/13]
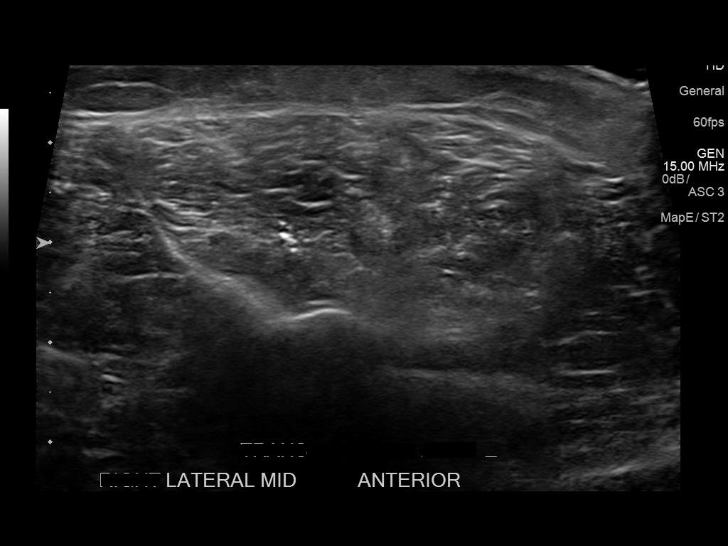
[im 2/13]
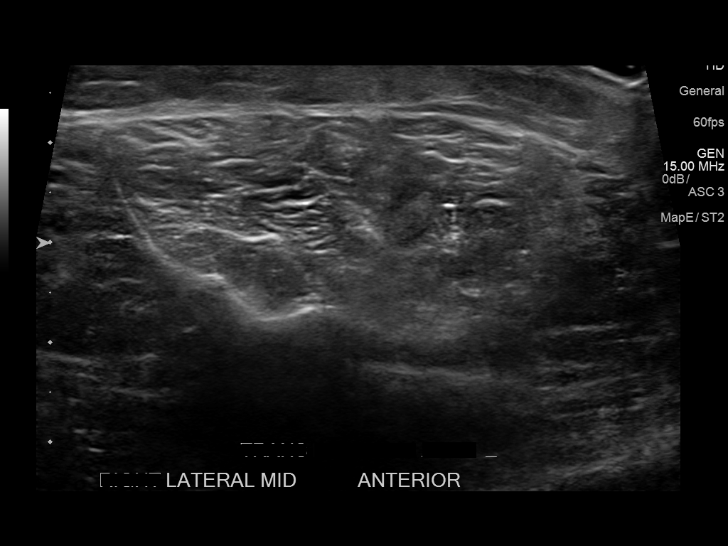
[im 3/13]
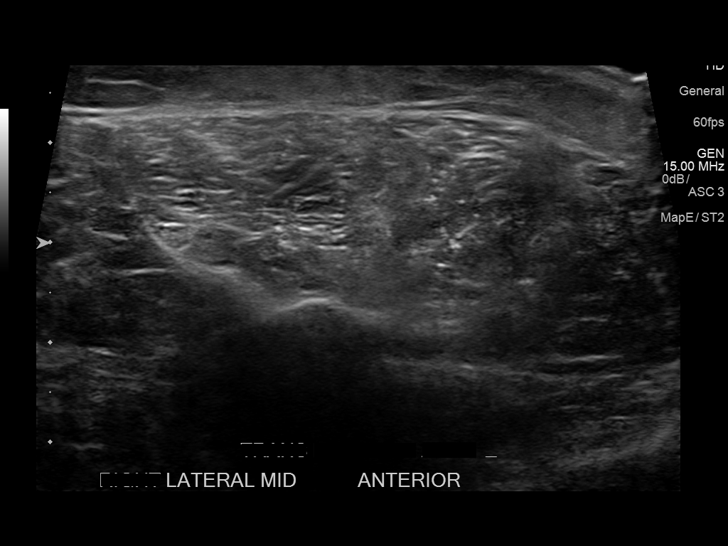
[im 4/13]
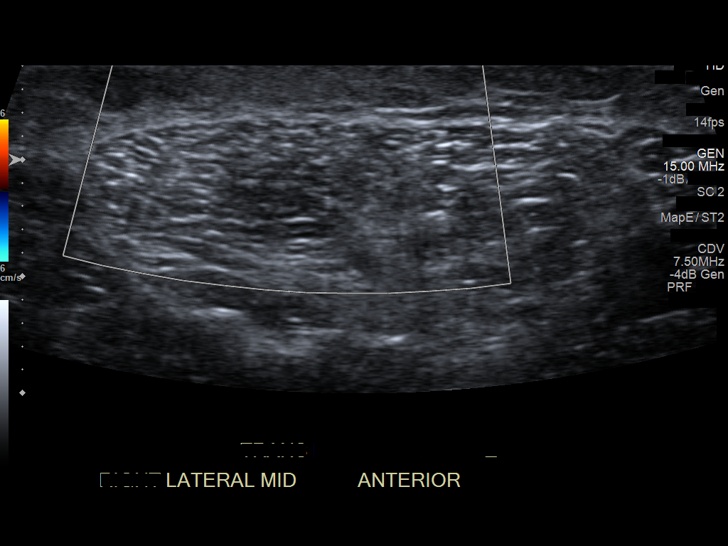
[im 5/13]
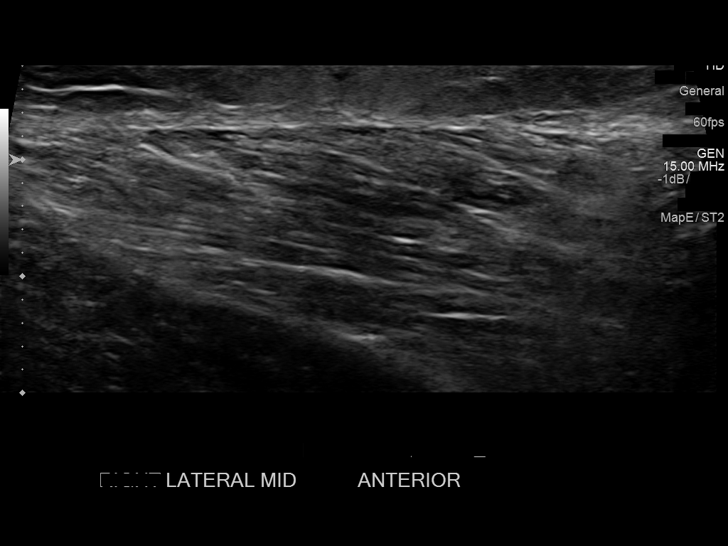
[im 6/13]
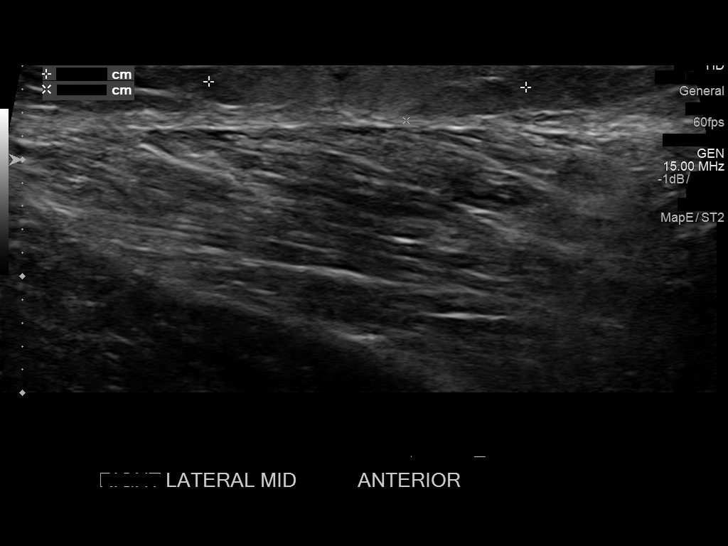
[im 7/13]
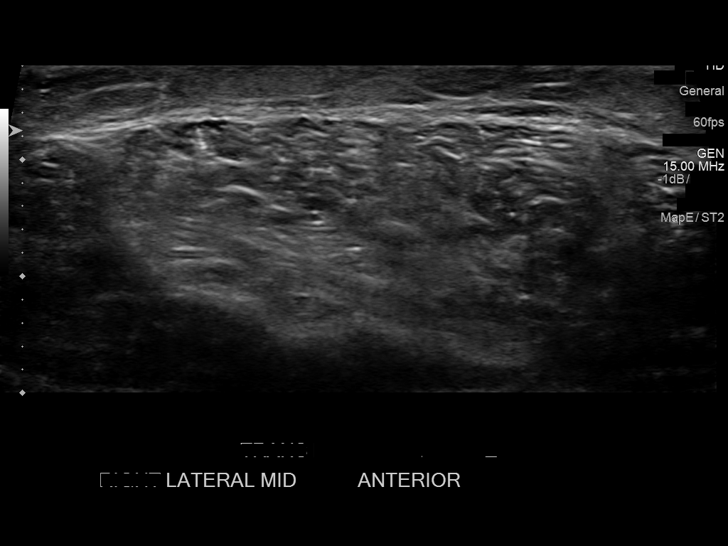
[im 8/13]
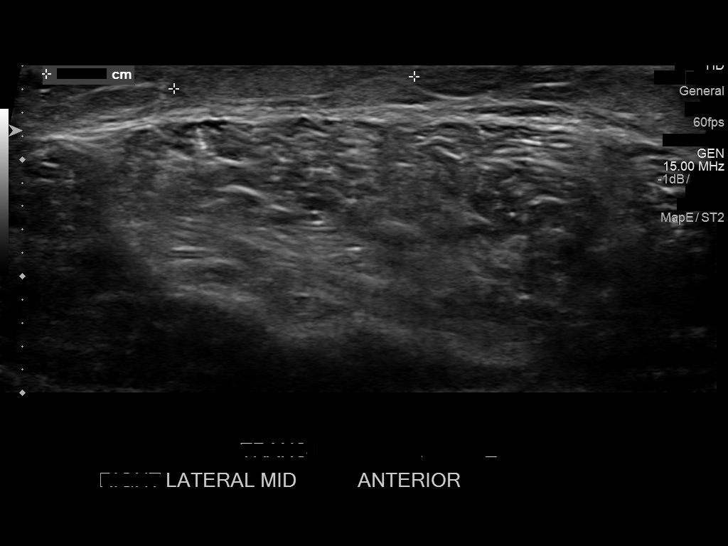
[im 9/13]
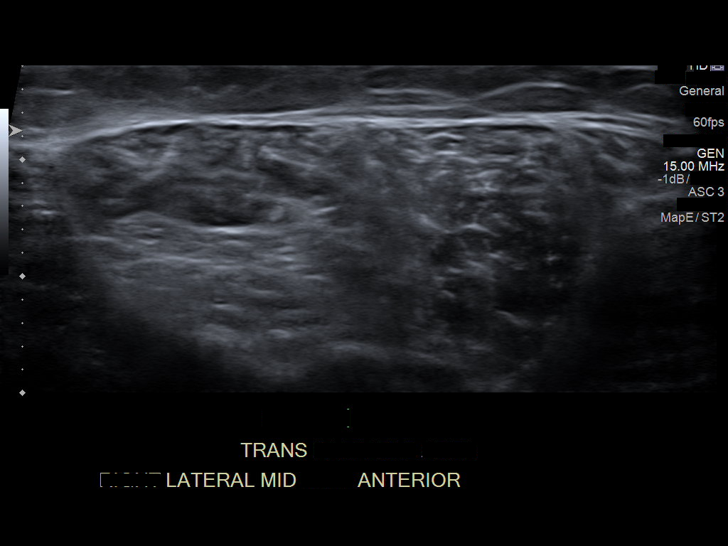
[im 10/13]
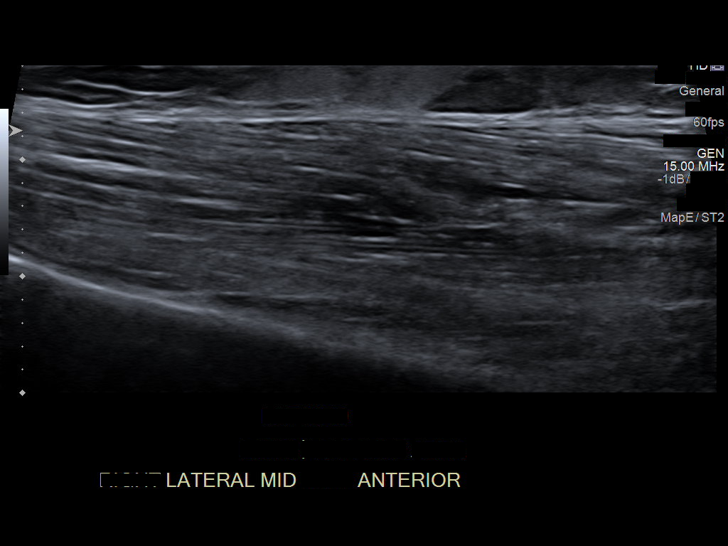
[im 11/13]
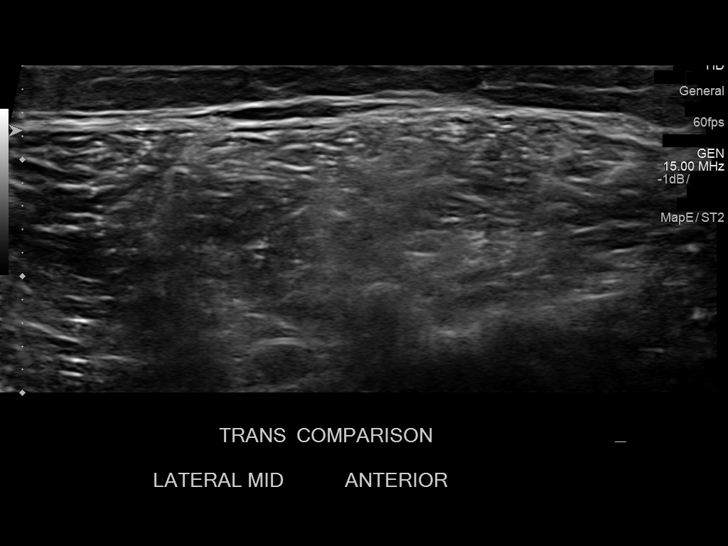
[im 12/13]
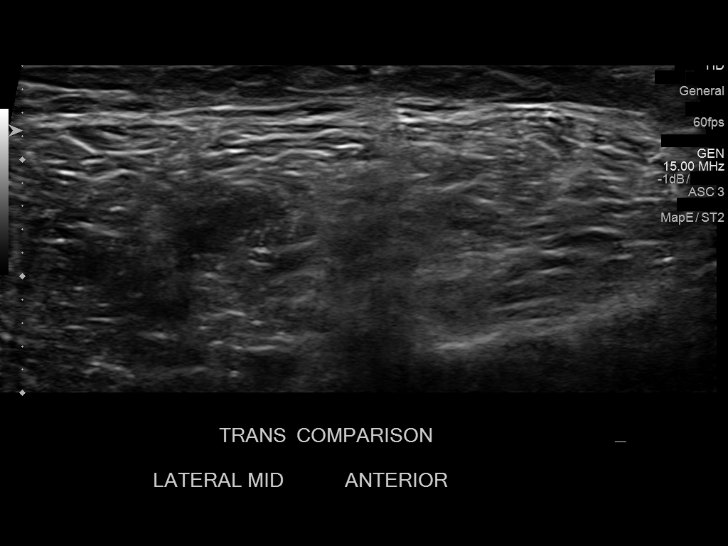
[im 13/13]
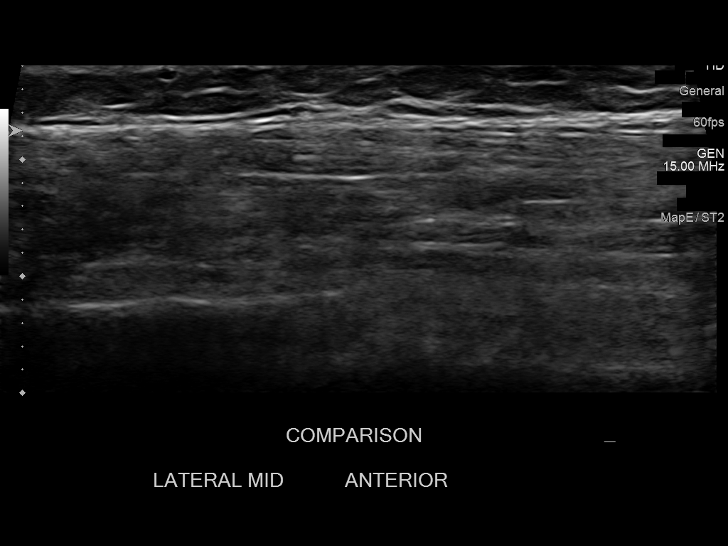

[13 of 13 positions shown; findings below may reference images not displayed]

FINDINGS: Palpable abnormality corresponds to an area of increased
echogenicity within the subcutaneous fat, superficial to the muscle
fascia. This area measures approximately 2.7 x 0.5 x 2.1 cm. No
significant blood flow on Doppler evaluation. No cystic component.
Underlying muscles are normal.
IMPRESSION: Palpable abnormality corresponds to an area of increased
echogenicity in the subcutaneous fat of the right lateral calf. No
cystic component. No significant blood flow. This may represent
hematoma or possibly infection. Neoplasm is a consideration and
close follow-up is recommended.

## 2018-06-25 IMAGING — US US EXTREM LOW VENOUS*R*
1 series · 13 of 24 positions shown · non-contrast
Comparison: None.

CLINICAL DATA: Palpable mass right lateral calf.  Rule out DVT



[Series 1: us extrem low venous*right* · 0.08mm/px · 13 of 31 slices shown]
[im 1/31]
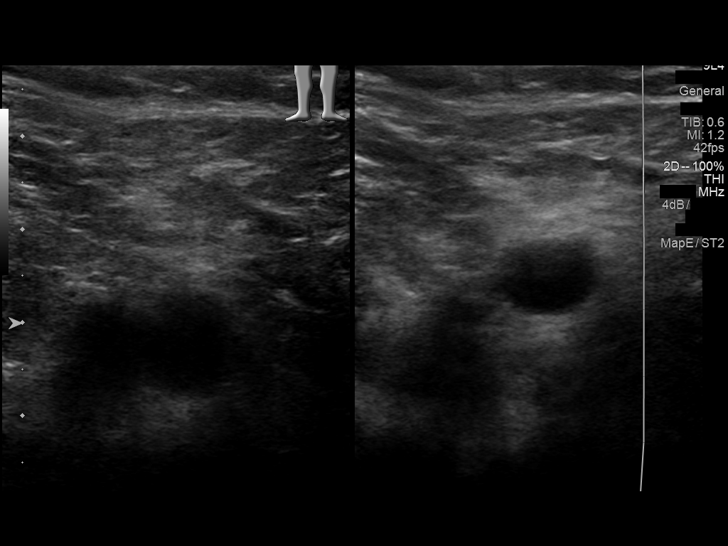
[im 3/31]
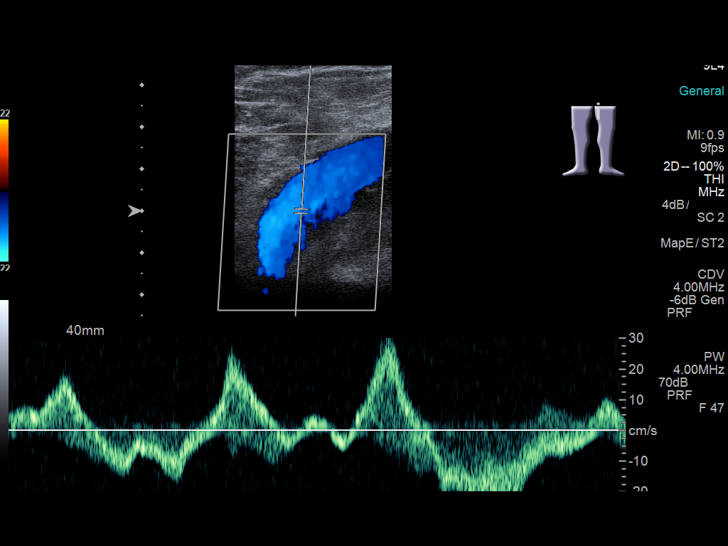
[im 6/31]
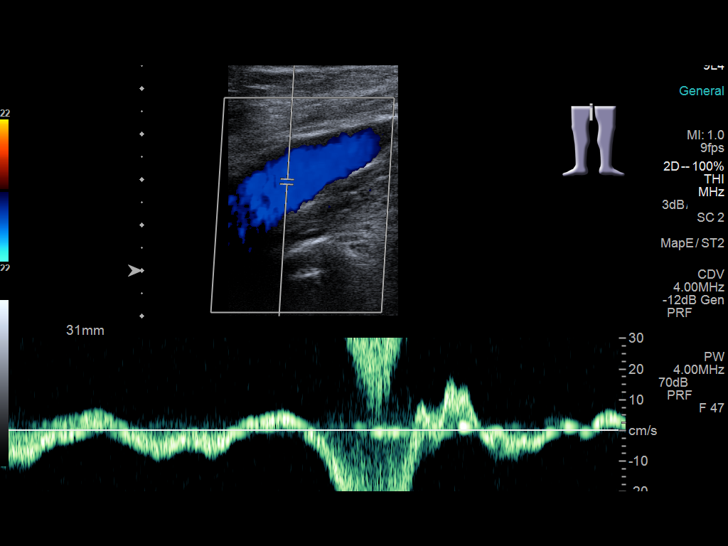
[im 8/31]
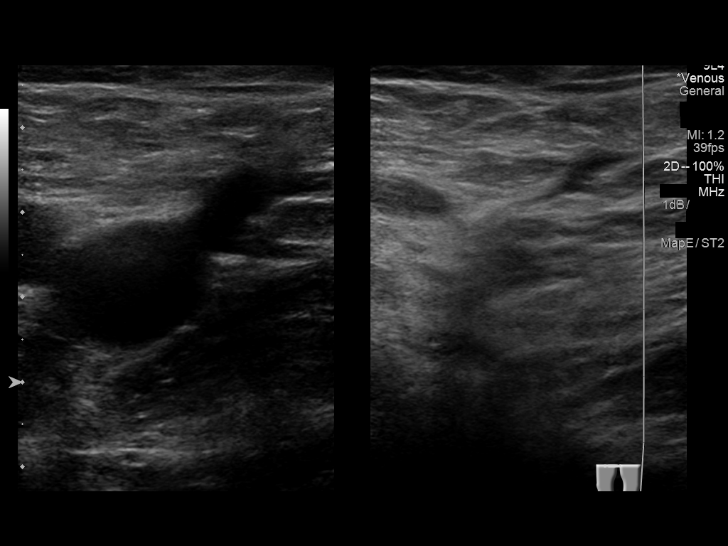
[im 11/31]
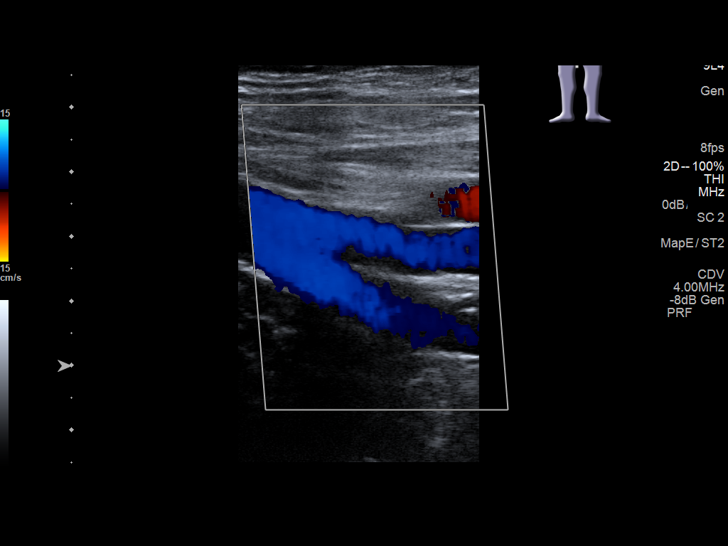
[im 14/31]
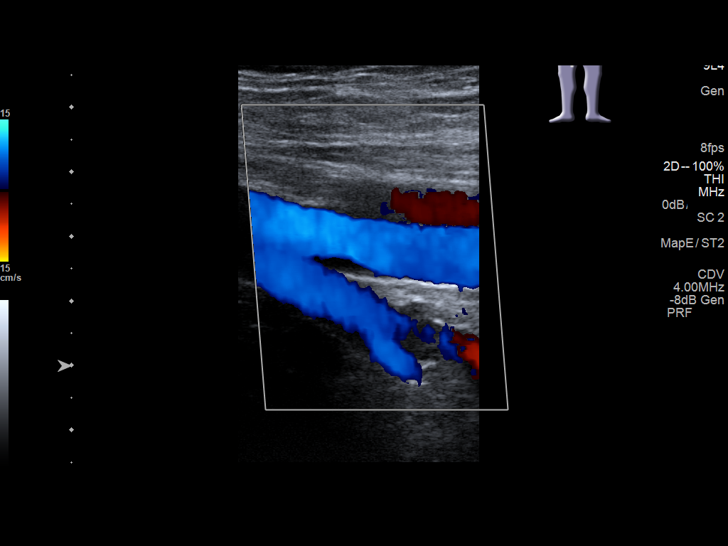
[im 16/31]
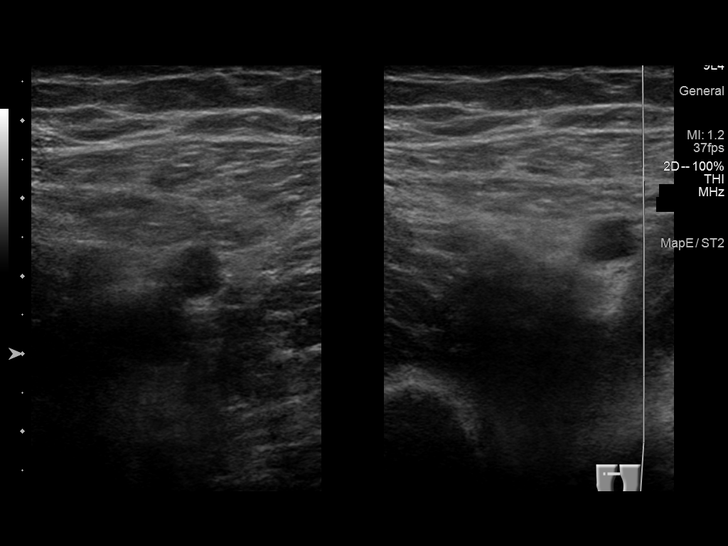
[im 17/31]
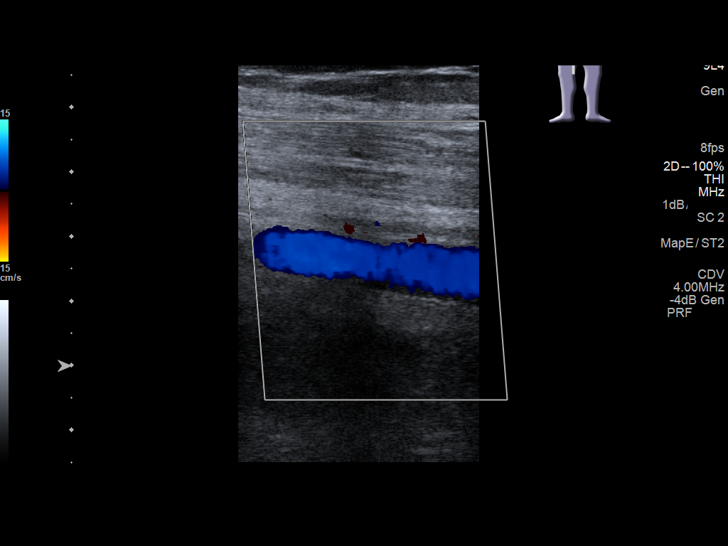
[im 20/31]
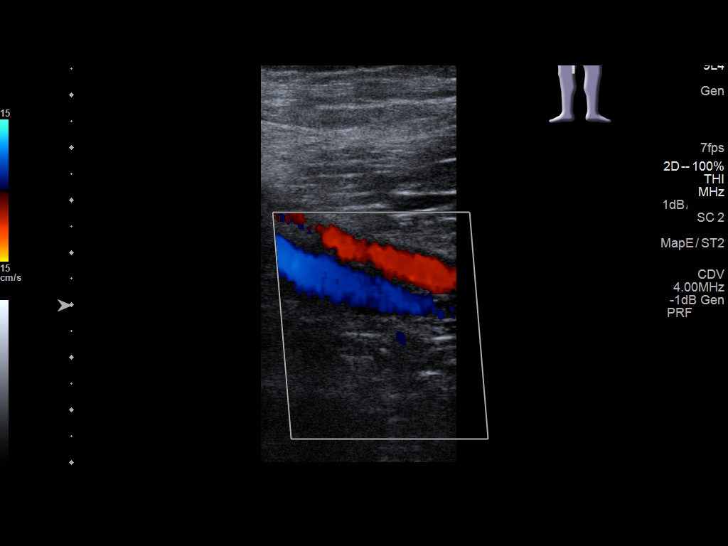
[im 23/31]
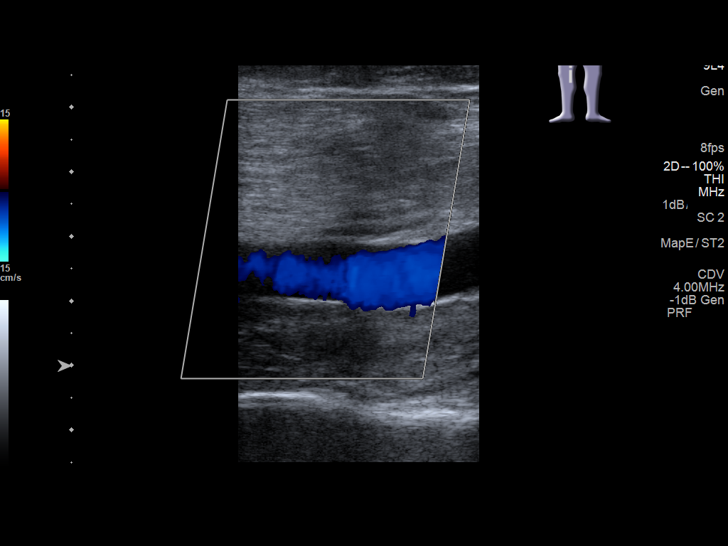
[im 25/31]
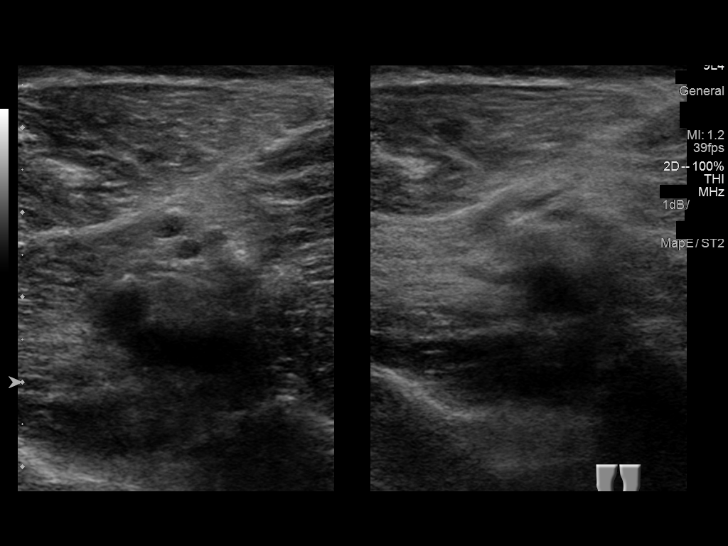
[im 28/31]
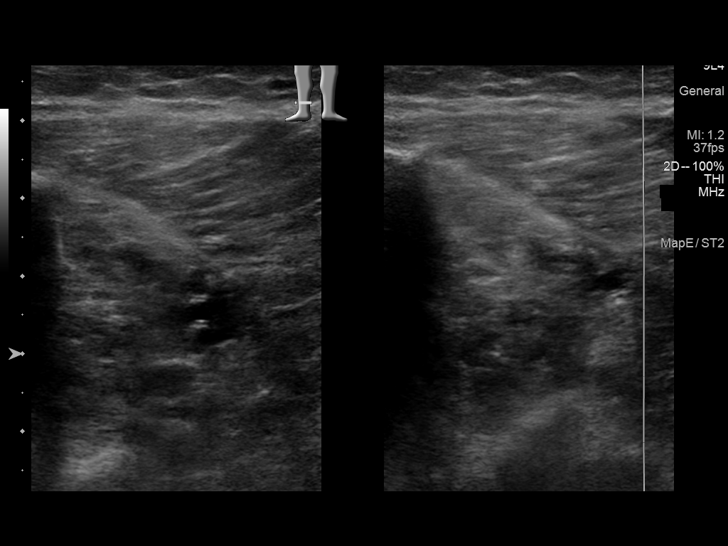
[im 31/31]
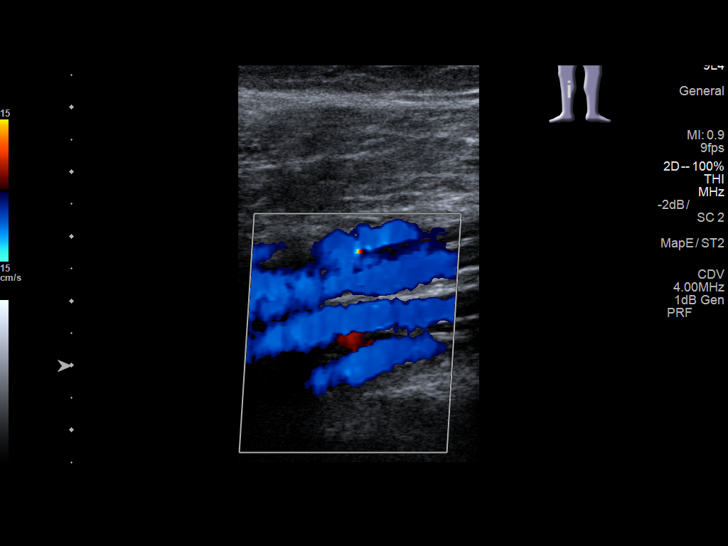

[13 of 24 positions shown; findings below may reference images not displayed]

FINDINGS: Contralateral Common Femoral Vein: Respiratory phasicity is normal
and symmetric with the symptomatic side. No evidence of thrombus.
Normal compressibility.

Common Femoral Vein: No evidence of thrombus. Normal
compressibility, respiratory phasicity and response to augmentation.

Saphenofemoral Junction: No evidence of thrombus. Normal
compressibility and flow on color Doppler imaging.

Profunda Femoral Vein: No evidence of thrombus. Normal
compressibility and flow on color Doppler imaging.

Femoral Vein: No evidence of thrombus. Normal compressibility,
respiratory phasicity and response to augmentation.

Popliteal Vein: No evidence of thrombus. Normal compressibility,
respiratory phasicity and response to augmentation.

Calf Veins: No evidence of thrombus. Normal compressibility and flow
on color Doppler imaging.

Superficial Great Saphenous Vein: No evidence of thrombus. Normal
compressibility and flow on color Doppler imaging.

Venous Reflux:  None.

Other Findings:  None.
IMPRESSION: No evidence of deep venous thrombosis.

## 2018-11-15 IMAGING — US US ABDOMEN LIMITED
1 series · 14 of 25 positions shown · non-contrast
Comparison: None.

CLINICAL DATA: Right upper quadrant pain off and on for 3 months

EXAM:
US ABDOMEN LIMITED - RIGHT UPPER QUADRANT

[Series 1: us abdomen limited · 0.20mm/px · 14 of 47 slices shown]
[im 1/47]
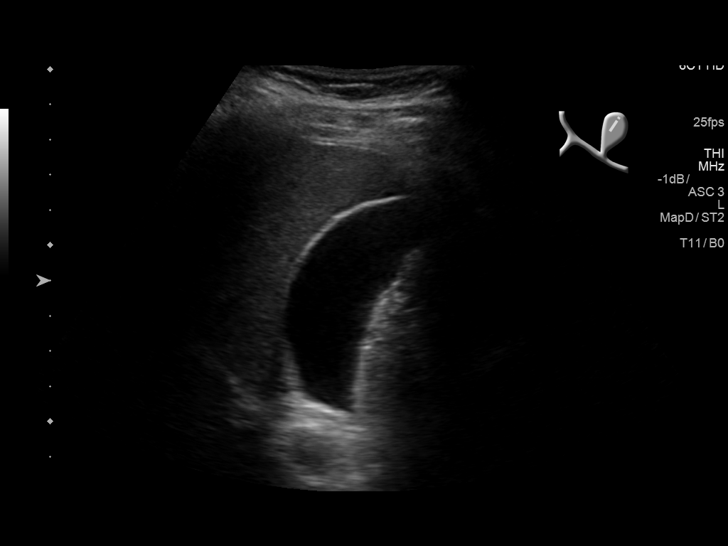
[im 4/47]
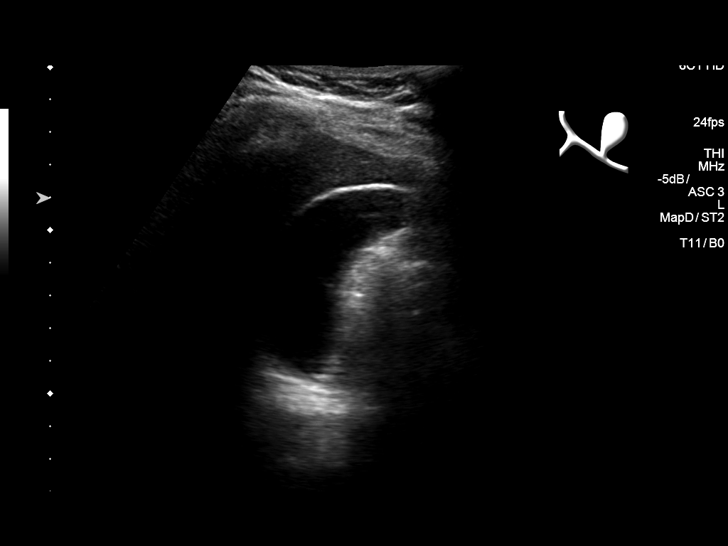
[im 8/47]
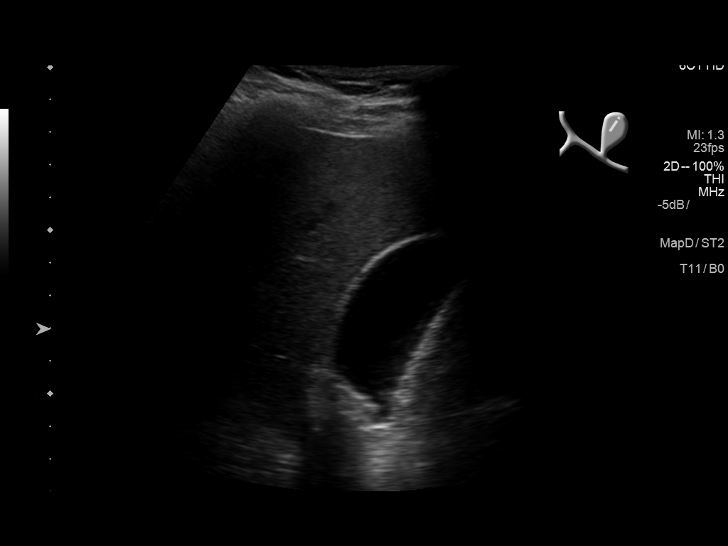
[im 12/47]
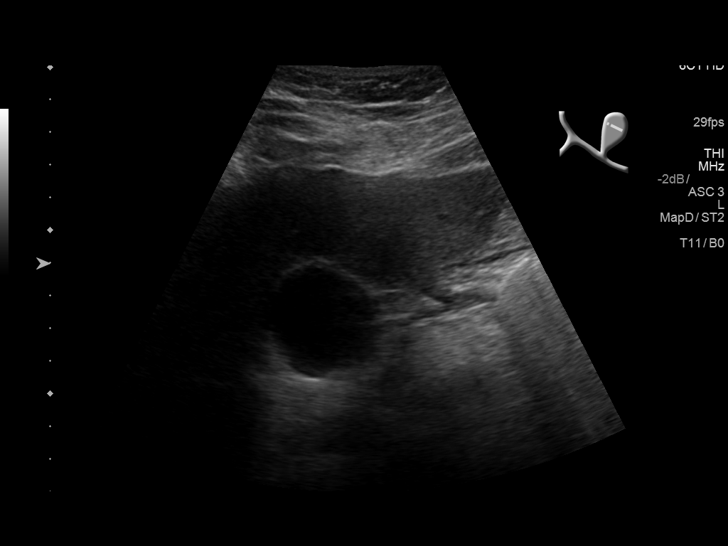
[im 16/47]
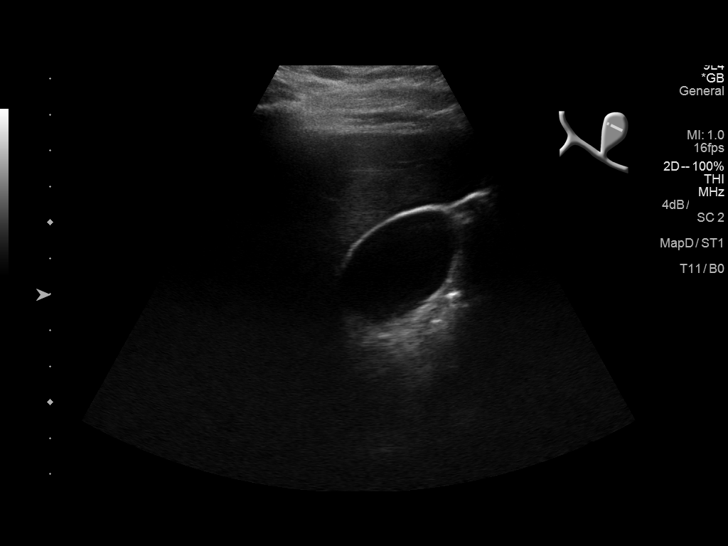
[im 18/47]
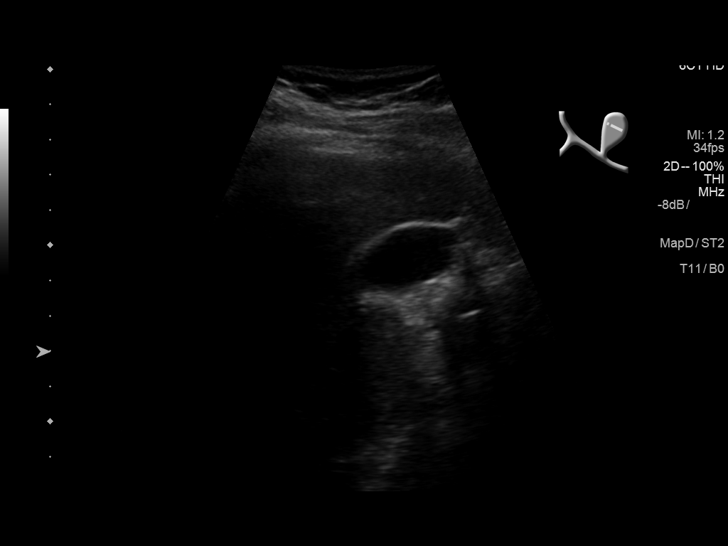
[im 22/47]
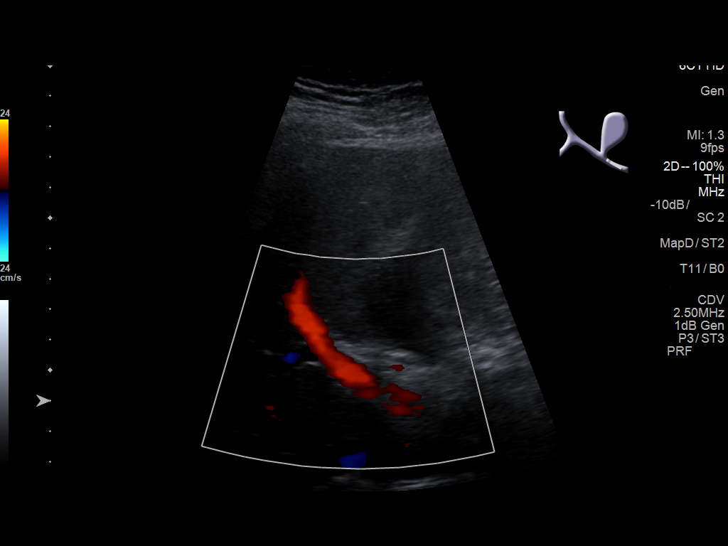
[im 25/47]
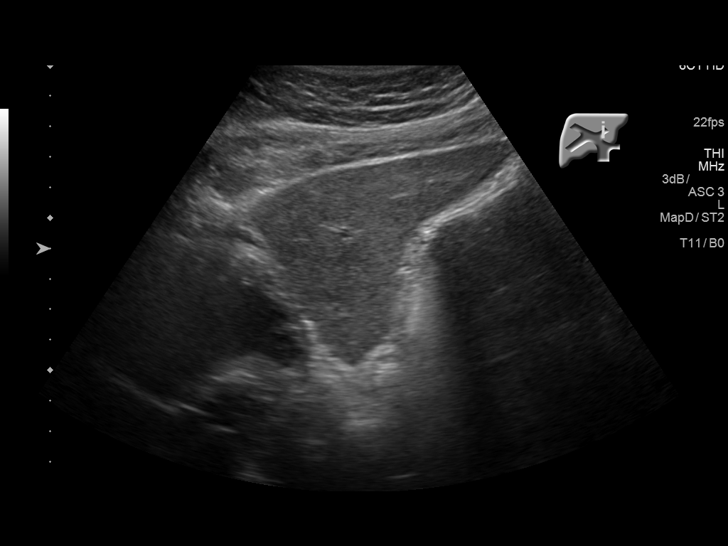
[im 29/47]
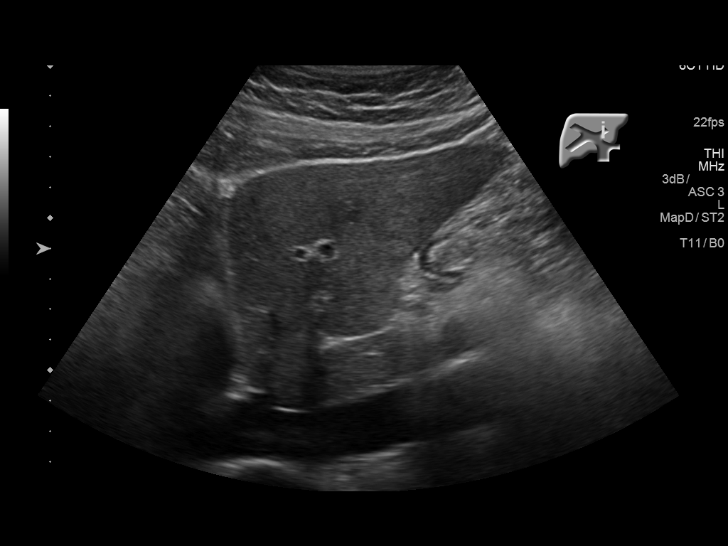
[im 31/47]
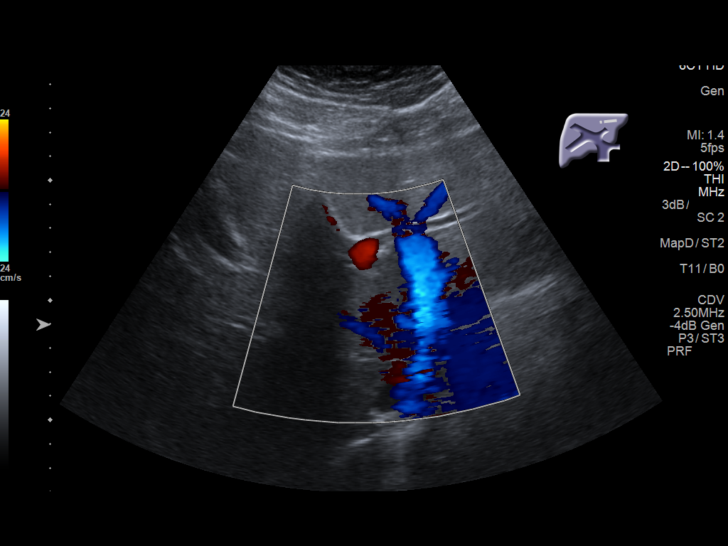
[im 35/47]
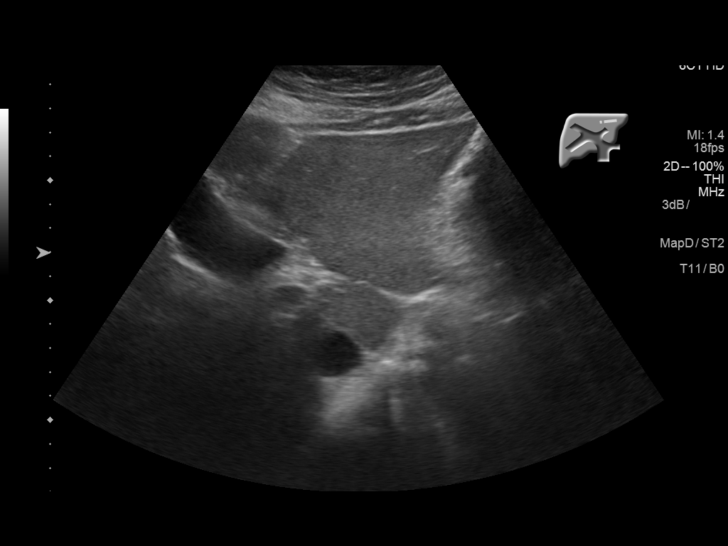
[im 39/47]
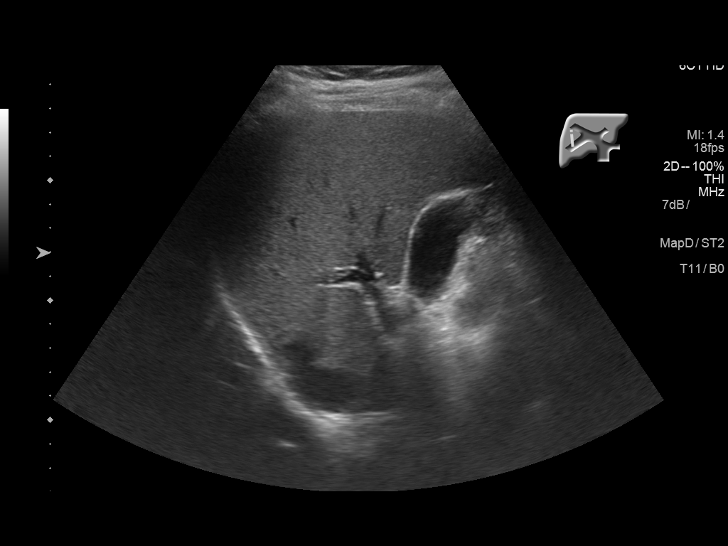
[im 43/47]
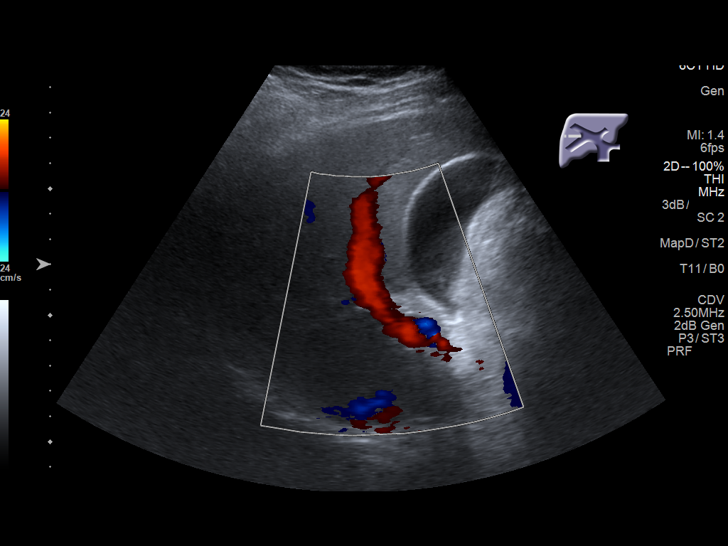
[im 47/47]
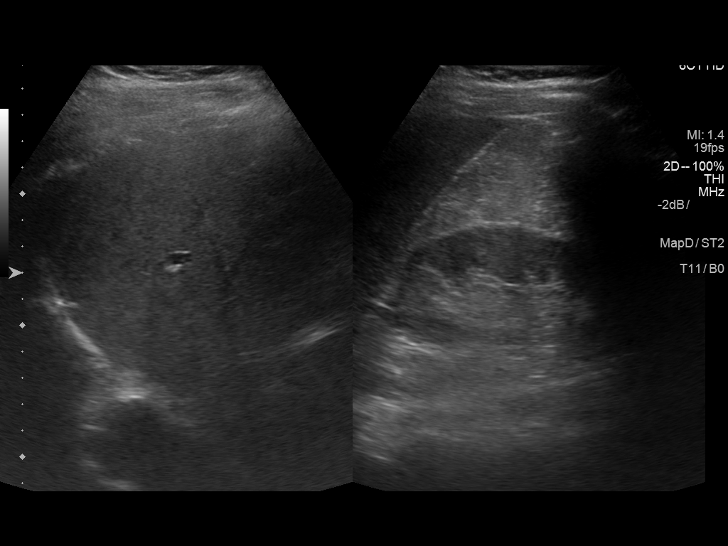

[14 of 25 positions shown; findings below may reference images not displayed]

FINDINGS: Gallbladder:

No gallstones or wall thickening visualized. No sonographic Murphy
sign noted by sonographer.

Common bile duct:

Diameter: Normal caliber, 2 mm

Liver:

No focal lesion identified. Within normal limits in parenchymal
echogenicity.
IMPRESSION: Negative right upper quadrant ultrasound.

## 2018-11-29 DIAGNOSIS — R739 Hyperglycemia, unspecified: Secondary | ICD-10-CM

## 2018-11-30 ENCOUNTER — Telehealth: Payer: Self-pay

## 2018-11-30 NOTE — Telephone Encounter (Signed)
Please set him up for labs in the next few days (before next week's visit). Review safety measures as well--thanks

## 2018-11-30 NOTE — Telephone Encounter (Signed)
Left detailed VM w COVID screen and curbside info   

## 2018-12-01 ENCOUNTER — Other Ambulatory Visit (INDEPENDENT_AMBULATORY_CARE_PROVIDER_SITE_OTHER): Payer: BC Managed Care – PPO

## 2018-12-01 DIAGNOSIS — R739 Hyperglycemia, unspecified: Secondary | ICD-10-CM

## 2018-12-01 LAB — LIPID PANEL
Cholesterol: 203 mg/dL — ABNORMAL HIGH (ref 0–200)
HDL: 35.2 mg/dL — ABNORMAL LOW (ref >39.00)
LDL Cholesterol: 137 mg/dL — ABNORMAL HIGH (ref 0–99)
NonHDL: 167.86
Total CHOL/HDL Ratio: 6
Triglycerides: 152 mg/dL — ABNORMAL HIGH (ref 0.0–149.0)
VLDL: 30.4 mg/dL (ref 0.0–40.0)

## 2018-12-01 LAB — CBC
HCT: 46.6 % (ref 39.0–52.0)
Hemoglobin: 16.2 g/dL (ref 13.0–17.0)
MCHC: 34.7 g/dL (ref 30.0–36.0)
MCV: 85.4 fl (ref 78.0–100.0)
Platelets: 227 10*3/uL (ref 150.0–400.0)
RBC: 5.46 Mil/uL (ref 4.22–5.81)
RDW: 12.7 % (ref 11.5–15.5)
WBC: 6.1 10*3/uL (ref 4.0–10.5)

## 2018-12-01 LAB — HEMOGLOBIN A1C: Hgb A1c MFr Bld: 5.3 % (ref 4.6–6.5)

## 2018-12-01 LAB — COMPREHENSIVE METABOLIC PANEL
ALT: 29 U/L (ref 0–53)
AST: 18 U/L (ref 0–37)
Albumin: 4.4 g/dL (ref 3.5–5.2)
Alkaline Phosphatase: 88 U/L (ref 39–117)
BUN: 18 mg/dL (ref 6–23)
CO2: 31 mEq/L (ref 19–32)
Calcium: 9.3 mg/dL (ref 8.4–10.5)
Chloride: 102 mEq/L (ref 96–112)
Creatinine, Ser: 1.24 mg/dL (ref 0.40–1.50)
GFR: 61.6 mL/min (ref >60.00)
Glucose, Bld: 95 mg/dL (ref 70–99)
Potassium: 4.3 mEq/L (ref 3.5–5.1)
Sodium: 139 mEq/L (ref 135–145)
Total Bilirubin: 0.7 mg/dL (ref 0.2–1.2)
Total Protein: 6.7 g/dL (ref 6.0–8.3)

## 2018-12-01 LAB — T4, FREE: Free T4: 0.83 ng/dL (ref 0.60–1.60)

## 2018-12-08 ENCOUNTER — Ambulatory Visit (INDEPENDENT_AMBULATORY_CARE_PROVIDER_SITE_OTHER): Payer: BC Managed Care – PPO | Admitting: Internal Medicine

## 2018-12-08 ENCOUNTER — Encounter: Payer: Self-pay | Admitting: Internal Medicine

## 2018-12-08 ENCOUNTER — Other Ambulatory Visit: Payer: Self-pay

## 2018-12-08 VITALS — BP 124/86 | HR 65 | Temp 97.7°F | Ht 69.0 in | Wt 185.0 lb

## 2018-12-08 DIAGNOSIS — Z1211 Encounter for screening for malignant neoplasm of colon: Secondary | ICD-10-CM | POA: Diagnosis not present

## 2018-12-08 DIAGNOSIS — Z23 Encounter for immunization: Secondary | ICD-10-CM | POA: Diagnosis not present

## 2018-12-08 DIAGNOSIS — G479 Sleep disorder, unspecified: Secondary | ICD-10-CM | POA: Diagnosis not present

## 2018-12-08 DIAGNOSIS — Z0000 Encounter for general adult medical examination without abnormal findings: Secondary | ICD-10-CM | POA: Diagnosis not present

## 2018-12-08 NOTE — Assessment & Plan Note (Signed)
Healthy Work on fitness Td booster today Flu vaccine in the fall colonoscopy

## 2018-12-08 NOTE — Assessment & Plan Note (Signed)
Restless sleep--some apnea per wife Daytime somnolence Will set up sleep eval  If this improves, and still ED---will Rx sildenafil

## 2018-12-08 NOTE — Addendum Note (Signed)
Addended by: Pilar Grammes on: 12/08/2018 12:35 PM   Modules accepted: Orders

## 2018-12-08 NOTE — Progress Notes (Signed)
Subjective:    Patient ID: Miguel Jackson, male    DOB: 02-Mar-1971, 48 y.o.   MRN: [id number]  HPI Here for physical  "I have zero energy"---mostly about an hour after eating Hard to stay awake then Also noted ED for a couple of years Sleep is variable---restless about half the time Snores if supine---wife has noted some apnea  Plantar fasciitis issues for some time Notes visible vessels in feet Also had brown spots--worried him about diabetes Checked his sugars-- fasting in 90's  Not exercising regularly Hopes to get back to his off road mountain biking  Current Outpatient Medications on File Prior to Visit  Medication Sig Dispense Refill  . famotidine (PEPCID) 20 MG tablet Take 20 mg by mouth daily.     No current facility-administered medications on file prior to visit.     No Known Allergies  Past Medical History:  Diagnosis Date  . Allergy     Past Surgical History:  Procedure Laterality Date  . SKULL FRACTURE ELEVATION  1991   depressed, needed repair    Family History  Problem Relation Age of Onset  . Diabetes Maternal Grandmother   . Cancer Maternal Grandfather        skin    Social History   Socioeconomic History  . Marital status: Married    Spouse name: Not on file  . Number of children: 2  . Years of education: Not on file  . Highest education level: Not on file  Occupational History  . Occupation: IT    Comment: [employer]  . Financial resource strain: Not on file  . Food insecurity    Worry: Not on file    Inability: Not on file  . Transportation needs    Medical: Not on file    Non-medical: Not on file  Tobacco Use  . Smoking status: Never Smoker  . Smokeless tobacco: Never Used  Substance and Sexual Activity  . Alcohol use: Yes  . Drug use: No  . Sexual activity: Not on file  Lifestyle  . Physical activity    Days per week: Not on file    Minutes per session: Not on file  . Stress: Not on file   Relationships  . Social Herbalist on phone: Not on file    Gets together: Not on file    Attends religious service: Not on file    Active member of club or organization: Not on file    Attends meetings of clubs or organizations: Not on file    Relationship status: Not on file  . Intimate partner violence    Fear of current or ex partner: Not on file    Emotionally abused: Not on file    Physically abused: Not on file    Forced sexual activity: Not on file  Other Topics Concern  . Not on file  Social History Narrative       Review of Systems  Constitutional: Positive for fatigue. Negative for unexpected weight change.       Wears seat belt  HENT: Negative for hearing loss, tinnitus and trouble swallowing.        Receeding gumline---stable  Eyes: Negative for visual disturbance.       No diplopia or unilateral vision loss  Respiratory: Negative for cough, chest tightness and shortness of breath.   Cardiovascular: Negative for chest pain, palpitations and leg swelling.  Gastrointestinal: Negative for abdominal pain, blood in stool  and constipation.       Occasional acid reflux--- famotidine works  Endocrine: Negative for polydipsia and polyuria.  Genitourinary: Negative for difficulty urinating and urgency.       Nocturia usually once  Musculoskeletal: Negative for arthralgias, back pain and joint swelling.  Skin: Negative for rash.       Mole on chest---wants it looked at Some raised lesions on vertex of head  Allergic/Immunologic: Positive for environmental allergies. Negative for immunocompromised state.       Uses flonase/zyrtec prn  Neurological: Negative for dizziness, syncope, light-headedness and headaches.  Hematological: Negative for adenopathy. Does not bruise/bleed easily.  Psychiatric/Behavioral: Positive for sleep disturbance. Negative for dysphoric mood.       Some work and world related anxiety       Objective:   Physical Exam  Constitutional:  He is oriented to person, place, and time. He appears well-developed. No distress.  HENT:  Head: Normocephalic and atraumatic.  Right Ear: External ear normal.  Left Ear: External ear normal.  Mouth/Throat: Oropharynx is clear and moist. No oropharyngeal exudate.  Eyes: Pupils are equal, round, and reactive to light. Conjunctivae are normal.  Neck: No thyromegaly present.  Cardiovascular: Normal rate, regular rhythm, normal heart sounds and intact distal pulses. Exam reveals no gallop.  No murmur heard. Respiratory: Effort normal and breath sounds normal. No respiratory distress. He has no wheezes. He has no rales.  GI: Soft. There is no abdominal tenderness.  Musculoskeletal:        General: No tenderness or edema.  Lymphadenopathy:    He has no cervical adenopathy.  Neurological: He is alert and oriented to person, place, and time.  Skin: No rash noted.  seb keratosis left abdomen Prominent superficial veins in feet No AKs on scalp  Psychiatric: He has a normal mood and affect. His behavior is normal.           Assessment & Plan:

## 2018-12-09 ENCOUNTER — Encounter: Payer: Self-pay | Admitting: Gastroenterology

## 2018-12-20 NOTE — Telephone Encounter (Signed)
Can you please find out what happened with the pulmonary referral I made (for sleep evaluation)?

## 2018-12-23 ENCOUNTER — Other Ambulatory Visit: Payer: Self-pay | Admitting: Internal Medicine

## 2018-12-23 MED ORDER — SILDENAFIL CITRATE 20 MG PO TABS: 60.0000 mg | ORAL_TABLET | Freq: Every day | ORAL | 5 refills | Status: DC | PRN

## 2018-12-30 ENCOUNTER — Ambulatory Visit (INDEPENDENT_AMBULATORY_CARE_PROVIDER_SITE_OTHER): Payer: BC Managed Care – PPO | Admitting: Internal Medicine

## 2018-12-30 DIAGNOSIS — G4719 Other hypersomnia: Secondary | ICD-10-CM

## 2018-12-30 NOTE — Progress Notes (Signed)
[facility] Consultation    Virtual Visit via Video Note I connected with patient on 12/30/18 at  2:45 PM EDT by video and verified that I am speaking with the correct person using two identifiers.   I discussed the limitations, risks of performing an evaluation and management service by video and the availability of in person appointments. I also discussed with the patient that there may be a patient responsible charge related to this service.  In light of current covid-19 pandemic, patient also understands that we are trying to protect them by minimizing in office contact if at all possible.  The patient expressed understanding and agreed to proceed. Please see note below for further detail.    The patient was advised to call back or seek an in-person evaluation if the symptoms worsen or if the condition fails to improve as anticipated.   Laverle Hobby, MD   Assessment and Plan:  Excessive daytime sleepiness. - Symptoms and signs of obstructive sleep apnea. - We will send for sleep study.  Orders Placed This Encounter  Procedures  . Home sleep test   Return in about 3 months (around 04/01/2019).    Date: 12/30/2018  MRN# [id number] KARIS EMIG 06/17/1971  Referring Physician: Dr. Rondel Jumbo is a 48 y.o. old male seen in consultation for chief complaint of:  Fatigue.    HPI:  PEARLY APACHITO is a 48 y.o. old male, he notes that he has lack of energy and fatigue. He has had lab results which were apparently normal. This has been going on for about a year of longer, it has been non progressive.  He goes to bed at 10 pm, wakes at 530, he feels tired. On off days he sleeps until 7 am and feels a bit better but still tired.  He admits to trouble concentrating and with memory.  No one in family with OSA, he has never been tested. His wife notes that he stops breathing and snores loudly.  Denies sleep paralysis, cataplexy,  sleep walking, morning headaches, denies jaw pain or TMJ.     PMHX:   Past Medical History:  Diagnosis Date  . Allergy    Surgical Hx:  Past Surgical History:  Procedure Laterality Date  . SKULL FRACTURE ELEVATION  1991   depressed, needed repair   Family Hx:  Family History  Problem Relation Age of Onset  . Diabetes Maternal Grandmother   . Cancer Maternal Grandfather        skin   Social Hx:   Social History   Tobacco Use  . Smoking status: Never Smoker  . Smokeless tobacco: Never Used  Substance Use Topics  . Alcohol use: Yes  . Drug use: No   Medication:    Current Outpatient Medications:  .  famotidine (PEPCID) 20 MG tablet, Take 20 mg by mouth daily., Disp: , Rfl:  .  sildenafil (REVATIO) 20 MG tablet, Take 3-5 tablets (60-100 mg total) by mouth daily as needed., Disp: 36 tablet, Rfl: 5   Allergies:  Patient has no known allergies.  Review of Systems: Gen:  Denies  fever, sweats, chills HEENT: Denies blurred vision, double vision. bleeds, sore throat Cvc:  No dizziness, chest pain. Resp:   Denies cough or sputum production, shortness of breath Gi: Denies swallowing difficulty, stomach pain. Gu:  Denies bladder incontinence, burning urine Ext:   No Joint pain, stiffness. Skin: No skin rash,  hives  Endoc:  No polyuria, polydipsia.  Psych: No depression, insomnia. Other:  All other systems were reviewed with the patient and were negative other that what is mentioned in the HPI.     LABORATORY PANEL:   CBC No results for input(s): WBC, HGB, HCT, PLT in the last 168 hours. ------------------------------------------------------------------------------------------------------------------  Chemistries  No results for input(s): NA, K, CL, CO2, GLUCOSE, BUN, CREATININE, CALCIUM, MG, AST, ALT, ALKPHOS, BILITOT in the last 168 hours.  Invalid input(s): GFRCGP  ------------------------------------------------------------------------------------------------------------------  Cardiac Enzymes No results for input(s): TROPONINI in the last 168 hours. ------------------------------------------------------------  RADIOLOGY:  No results found.     Thank  you for the consultation and for allowing [facility] to assist in the care of your patient. Our recommendations are noted above.  Please contact us if we can be of further service.   Marda Stalker, M.D., F.C.C.P.  Board Certified in Internal Medicine, Pulmonary Medicine, [place], and Sleep Medicine.  [facility] Office Number: [phone]   12/30/2018

## 2018-12-30 NOTE — Patient Instructions (Signed)

## 2019-01-07 ENCOUNTER — Ambulatory Visit: Payer: BC Managed Care – PPO | Admitting: *Deleted

## 2019-01-07 ENCOUNTER — Other Ambulatory Visit: Payer: Self-pay

## 2019-01-07 VITALS — Ht 69.0 in | Wt 182.0 lb

## 2019-01-07 DIAGNOSIS — Z1211 Encounter for screening for malignant neoplasm of colon: Secondary | ICD-10-CM

## 2019-01-07 MED ORDER — SUPREP BOWEL PREP KIT 17.5-3.13-1.6 GM/177ML PO SOLN: 1.0000 | Freq: Once | ORAL | 0 refills | Status: AC

## 2019-01-07 NOTE — Progress Notes (Signed)
No egg or soy allergy known to patient  issues with past sedation with any surgeries  or procedures- woke during skull fx surgery , no intubation problems  No diet pills per patient No home 02 use per patient  No blood thinners per patient  Pt denies issues with constipation  No A fib or A flutter  EMMI video sent to pt's e mail   Pt verified name, DOB, address and insurance during PV today. Pt mailed instruction packet to included paper to complete and mail back to [facility] with addressed and stamped envelope, Emmi video, copy of consent form to read and not return, and instructions. [employer] $15  coupon mailed in packet. PV completed over the phone. Pt encouraged to call with questions or issues   Pt is aware that care partner will wait in the car during procedure; if they feel like they will be too hot to wait in the car; they may wait in the lobby.  We want them to wear a mask (we do not have any that we can provide them), practice social distancing, and we will check their temperatures when they get here.  I did remind patient that their care partner needs to stay in the parking lot the entire time. Pt will wear mask into building.

## 2019-01-20 ENCOUNTER — Telehealth: Payer: Self-pay | Admitting: Gastroenterology

## 2019-01-20 NOTE — Telephone Encounter (Signed)
Pt responded "no" to all screening questions °

## 2019-01-20 NOTE — Telephone Encounter (Signed)
Left message on vmail for patient regarding Covid-19 screening questions. °Covid-19 Screening Questions: °  °Do you now or have you had a fever in the last 14 days?  °  °Do you have any respiratory symptoms of shortness of breath or cough now or in the last 14 days?  °  °Do you have any family members or close contacts with diagnosed or suspected Covid-19 in the past 14 days?  °  °Have you been tested for Covid-19 and found to be positive?  °  ° °

## 2019-01-21 ENCOUNTER — Encounter: Payer: Self-pay | Admitting: Gastroenterology

## 2019-01-21 ENCOUNTER — Other Ambulatory Visit: Payer: Self-pay

## 2019-01-21 ENCOUNTER — Ambulatory Visit (AMBULATORY_SURGERY_CENTER): Payer: BC Managed Care – PPO | Admitting: Gastroenterology

## 2019-01-21 VITALS — BP 131/80 | HR 59 | Temp 98.4°F | Resp 12 | Ht 69.0 in | Wt 185.0 lb

## 2019-01-21 DIAGNOSIS — D128 Benign neoplasm of rectum: Secondary | ICD-10-CM | POA: Diagnosis not present

## 2019-01-21 DIAGNOSIS — D124 Benign neoplasm of descending colon: Secondary | ICD-10-CM | POA: Diagnosis not present

## 2019-01-21 DIAGNOSIS — D129 Benign neoplasm of anus and anal canal: Secondary | ICD-10-CM

## 2019-01-21 DIAGNOSIS — K635 Polyp of colon: Secondary | ICD-10-CM

## 2019-01-21 DIAGNOSIS — Z1211 Encounter for screening for malignant neoplasm of colon: Secondary | ICD-10-CM | POA: Diagnosis present

## 2019-01-21 DIAGNOSIS — D122 Benign neoplasm of ascending colon: Secondary | ICD-10-CM | POA: Diagnosis not present

## 2019-01-21 MED ORDER — SODIUM CHLORIDE 0.9 % IV SOLN: 500.0000 mL | Freq: Once | INTRAVENOUS | Status: DC

## 2019-01-21 NOTE — Progress Notes (Signed)
Called to room to assist during endoscopic procedure.  Patient ID and intended procedure confirmed with present staff. Received instructions for my participation in the procedure from the performing physician.  

## 2019-01-21 NOTE — Patient Instructions (Signed)
Discharge instructions given. Handout on polyps. Resume previous medications. YOU HAD AN ENDOSCOPIC PROCEDURE TODAY AT THE [facility]:   Refer to the procedure report that was given to you for any specific questions about what was found during the examination.  If the procedure report does not answer your questions, please call your gastroenterologist to clarify.  If you requested that your care partner not be given the details of your procedure findings, then the procedure report has been included in a sealed envelope for you to review at your convenience later.  YOU SHOULD EXPECT: Some feelings of bloating in the abdomen. Passage of more gas than usual.  Walking can help get rid of the air that was put into your GI tract during the procedure and reduce the bloating. If you had a lower endoscopy (such as a colonoscopy or flexible sigmoidoscopy) you may notice spotting of blood in your stool or on the toilet paper. If you underwent a bowel prep for your procedure, you may not have a normal bowel movement for a few days.  Please Note:  You might notice some irritation and congestion in your nose or some drainage.  This is from the oxygen used during your procedure.  There is no need for concern and it should clear up in a day or so.  SYMPTOMS TO REPORT IMMEDIATELY:   Following lower endoscopy (colonoscopy or flexible sigmoidoscopy):  Excessive amounts of blood in the stool  Significant tenderness or worsening of abdominal pains  Swelling of the abdomen that is new, acute  Fever of 100F or higher   For urgent or emergent issues, a gastroenterologist can be reached at any hour by calling [phone].   DIET:  We do recommend a small meal at first, but then you may proceed to your regular diet.  Drink plenty of fluids but you should avoid alcoholic beverages for 24 hours.  ACTIVITY:  You should plan to take it easy for the rest of today and you should NOT DRIVE or use heavy  machinery until tomorrow (because of the sedation medicines used during the test).    FOLLOW UP: Our staff will call the number listed on your records 48-72 hours following your procedure to check on you and address any questions or concerns that you may have regarding the information given to you following your procedure. If we do not reach you, we will leave a message.  We will attempt to reach you two times.  During this call, we will ask if you have developed any symptoms of COVID 19. If you develop any symptoms (ie: fever, flu-like symptoms, shortness of breath, cough etc.) before then, please call [phone].  If you test positive for Covid 19 in the 2 weeks post procedure, please call and report this information to us.    If any biopsies were taken you will be contacted by phone or by letter within the next 1-3 weeks.  Please call us at [phone] if you have not heard about the biopsies in 3 weeks.    SIGNATURES/CONFIDENTIALITY: You and/or your care partner have signed paperwork which will be entered into your electronic medical record.  These signatures attest to the fact that that the information above on your After Visit Summary has been reviewed and is understood.  Full responsibility of the confidentiality of this discharge information lies with you and/or your care-partner. 

## 2019-01-21 NOTE — Progress Notes (Signed)
Report given to PACU, vss 

## 2019-01-21 NOTE — Progress Notes (Signed)
Pt's states no medical or surgical changes since previsit or office visit.  Temp taken by DS VS taken by CW

## 2019-01-21 NOTE — Op Note (Signed)
[facility] Patient Name: Miguel Jackson Procedure Date: 01/21/2019 10:49 AM MRN: [id number] Endoscopist: Mallie Mussel L. Loletha Carrow , MD Age: 48 Referring MD:  Date of Birth: 09-08-1970 Gender: Male Account #: [id number] Procedure:                Colonoscopy Indications:              Screening for colorectal malignant neoplasm, This                            is the patient's first colonoscopy Medicines:                Monitored Anesthesia Care Procedure:                Pre-Anesthesia Assessment:                           - Prior to the procedure, a History and Physical                            was performed, and patient medications and                            allergies were reviewed. The patient's tolerance of                            previous anesthesia was also reviewed. The risks                            and benefits of the procedure and the sedation                            options and risks were discussed with the patient.                            All questions were answered, and informed consent                            was obtained. Prior Anticoagulants: The patient has                            taken no previous anticoagulant or antiplatelet                            agents. ASA Grade Assessment: II - A patient with                            mild systemic disease. After reviewing the risks                            and benefits, the patient was deemed in                            satisfactory condition to undergo the procedure.  After obtaining informed consent, the colonoscope                            was passed under direct vision. Throughout the                            procedure, the patient's blood pressure, pulse, and                            oxygen saturations were monitored continuously. The                            Colonoscope was introduced through the anus and                            advanced to the the  cecum, identified by                            appendiceal orifice and ileocecal valve. The                            colonoscopy was performed without difficulty. The                            patient tolerated the procedure well. The quality                            of the bowel preparation was good. The ileocecal                            valve, appendiceal orifice, and rectum were                            photographed. Scope In: 11:03:40 AM Scope Out: 11:27:21 AM Scope Withdrawal Time: 0 hours 21 minutes 29 seconds  Total Procedure Duration: 0 hours 23 minutes 41 seconds  Findings:                 The perianal and digital rectal examinations were                            normal.                           Two sessile polyps were found in the ascending                            colon. The polyps were diminutive in size. These                            polyps were removed with a cold snare. Resection                            and retrieval were complete.  A 5 mm polyp was found in the descending colon. The                            polyp was sessile. The polyp was removed with a                            cold snare. Resection and retrieval were complete.                           A 12 mm polyp was found in the rectum. The polyp                            was sessile. The polyp was removed with a hot                            snare. Resection and retrieval were complete.                           The exam was otherwise without abnormality on                            direct and retroflexion views. Complications:            No immediate complications. Estimated Blood Loss:     Estimated blood loss was minimal. Impression:               - Two diminutive polyps in the ascending colon,                            removed with a cold snare. Resected and retrieved.                           - One 5 mm polyp in the descending colon, removed                             with a cold snare. Resected and retrieved.                           - One 12 mm polyp in the rectum, removed with a hot                            snare. Resected and retrieved.                           - The examination was otherwise normal on direct                            and retroflexion views. Recommendation:           - Patient has a contact number available for                            emergencies. The signs and symptoms of potential  delayed complications were discussed with the                            patient. Return to normal activities tomorrow.                            Written discharge instructions were provided to the                            patient.                           - Resume previous diet.                           - Continue present medications.                           - Await pathology results.                           - Repeat colonoscopy is recommended for                            surveillance. The colonoscopy date will be                            determined after pathology results from today's                            exam become available for review. Henry L. Loletha Carrow, MD 01/21/2019 11:32:21 AM This report has been signed electronically.

## 2019-01-25 ENCOUNTER — Encounter: Payer: Self-pay | Admitting: Gastroenterology

## 2019-01-25 ENCOUNTER — Other Ambulatory Visit: Payer: Self-pay

## 2019-01-25 ENCOUNTER — Ambulatory Visit: Payer: BC Managed Care – PPO

## 2019-01-25 DIAGNOSIS — G4733 Obstructive sleep apnea (adult) (pediatric): Secondary | ICD-10-CM | POA: Diagnosis not present

## 2019-01-25 DIAGNOSIS — G4719 Other hypersomnia: Secondary | ICD-10-CM

## 2019-01-26 ENCOUNTER — Telehealth: Payer: Self-pay | Admitting: *Deleted

## 2019-01-26 NOTE — Telephone Encounter (Signed)
No answer for post procedure call back. Left message for patient to call with questions or concerns. SM 

## 2019-01-31 ENCOUNTER — Telehealth: Payer: Self-pay | Admitting: Internal Medicine

## 2019-01-31 DIAGNOSIS — G4733 Obstructive sleep apnea (adult) (pediatric): Secondary | ICD-10-CM | POA: Diagnosis not present

## 2019-01-31 NOTE — Telephone Encounter (Signed)
HST performed on 01/25/2019 confirmed severe OSA with AHI of 45, with very severe in supine position with AHI of 62. Recommend auto cpap 5-20cm h2O, and avoidance of supine position during sleep.  Pt is aware of results and voiced his understanding.  Order has been placed for cpap, as pt wished to proceed.  Pt has been scheduled for ov on 03/23/2019. Nothing further is needed at this time.

## 2019-03-23 ENCOUNTER — Ambulatory Visit: Payer: BC Managed Care – PPO | Admitting: Internal Medicine

## 2019-04-05 ENCOUNTER — Ambulatory Visit: Payer: BC Managed Care – PPO | Admitting: Internal Medicine

## 2019-04-27 ENCOUNTER — Encounter: Payer: Self-pay | Admitting: Internal Medicine

## 2019-04-27 ENCOUNTER — Other Ambulatory Visit: Payer: Self-pay

## 2019-04-27 ENCOUNTER — Ambulatory Visit: Payer: BC Managed Care – PPO | Admitting: Internal Medicine

## 2019-04-27 VITALS — BP 126/72 | HR 66 | Temp 97.2°F | Ht 69.0 in | Wt 190.6 lb

## 2019-04-27 DIAGNOSIS — G4733 Obstructive sleep apnea (adult) (pediatric): Secondary | ICD-10-CM | POA: Diagnosis not present

## 2019-04-27 NOTE — Patient Instructions (Addendum)
Continue auto CPAP as prescribed 5 to 10 cm water pressure

## 2019-04-27 NOTE — Progress Notes (Signed)
[facility] Consultation     Date: 04/27/2019  MRN# [id number] Miguel Jackson Feb 10, 1971  Referring Physician: Dr. Lubertha Sayres    PREVIOUS HPI he has lack of energy and fatigue. He has had lab results which were apparently normal. This has been going on for about a year of longer, it has been non progressive.  He goes to bed at 10 pm, wakes at 530, he feels tired. On off days he sleeps until 7 am and feels a bit better but still tired.  He admits to trouble concentrating and with memory.  No one in family with OSA, he has never been tested. His wife notes that he stops breathing and snores loudly.  Denies sleep paralysis, cataplexy, sleep walking, morning headaches, denies jaw pain or TMJ.   **Sleep study 02/04/2019 Severe sleep apnea AHI 46 300 episodes of desaturation   Chief complaint Follow-up excessive daytime sleepiness  HPI Sleep study results reviewed with patient AHI is 46 300 episodes of hypoxia  No evidence of heart failure at this time No evidence or signs of infection at this time No respiratory distress No fevers, chills, nausea, vomiting, diarrhea No evidence of lower extremity edema No evidence hemoptysis  Patient tolerating well Patient using and benefiting from auto CPAP therapy Patient has more energy More active Concentrating more Memory has improved   PMHX:   Past Medical History:  Diagnosis Date  . Allergy   . GERD (gastroesophageal reflux disease)    Surgical Hx:  Past Surgical History:  Procedure Laterality Date  . SKULL FRACTURE ELEVATION  1991   depressed, needed repair  . WISDOM TOOTH EXTRACTION  2019   Family Hx:  Family History  Problem Relation Age of Onset  . Diabetes Maternal Grandmother   . Cancer Maternal Grandfather        skin  . Colon cancer Neg Hx   . Colon polyps Neg Hx   . Esophageal cancer Neg Hx   . Rectal cancer Neg Hx   . Stomach cancer Neg Hx    Social Hx:   Social History    Tobacco Use  . Smoking status: Never Smoker  . Smokeless tobacco: Never Used  Substance Use Topics  . Alcohol use: Yes    Comment: socially   . Drug use: No   Medication:    Current Outpatient Medications:  .  famotidine (PEPCID) 20 MG tablet, Take 20 mg by mouth daily., Disp: , Rfl:  .  sildenafil (REVATIO) 20 MG tablet, Take 3-5 tablets (60-100 mg total) by mouth daily as needed., Disp: 36 tablet, Rfl: 5   Allergies:  Patient has no known allergies.   Review of Systems:  Gen:  Denies  fever, sweats, chills weight loss  HEENT: Denies blurred vision, double vision, ear pain, eye pain, hearing loss, nose bleeds, sore throat Cardiac:  No dizziness, chest pain or heaviness, chest tightness,edema, No JVD Resp:   No cough, -sputum production, -shortness of breath,-wheezing, -hemoptysis,  Gi: Denies swallowing difficulty, stomach pain, nausea or vomiting, diarrhea, constipation, bowel incontinence Gu:  Denies bladder incontinence, burning urine Ext:   Denies Joint pain, stiffness or swelling Skin: Denies  skin rash, easy bruising or bleeding or hives Endoc:  Denies polyuria, polydipsia , polyphagia or weight change Psych:   Denies depression, insomnia or hallucinations  Other:  All other systems negative  BP 126/72 (BP Location: Left Arm, Cuff Size: Normal)   Pulse 66   Temp (!) 97.2 F (36.2 C) (Temporal)  Ht 5\' 9"  (1.753 m)   Wt 190 lb 9.6 oz (86.5 kg)   SpO2 97%   BMI 28.15 kg/m   Physical Examination:   GENERAL:NAD, no fevers, chills, no weakness no fatigue HEAD: Normocephalic, atraumatic.  EYES: PERLA, EOMI No scleral icterus.  NECK: Supple. No thyromegaly.  No JVD.  PULMONARY: CTA B/L no wheezing, rhonchi, crackles CARDIOVASCULAR: S1 and S2. Regular rate and rhythm. No murmurs GASTROINTESTINAL: Soft, nontender, nondistended. Positive bowel sounds.  MUSCULOSKELETAL: No swelling, clubbing, or edema.  NEUROLOGIC: No gross focal neurological deficits. 5/5 strength  all extremities SKIN: No ulceration, lesions, rashes, or cyanosis.  PSYCHIATRIC: Insight, judgment intact. -depression -anxiety ALL OTHER ROS ARE NEGATIVE    Severe sleep apnea Continue auto CPAP therapy as prescribed Patient with excellent compliance report Patient AHI is significantly improved to 3.6  COVID-19 EDUCATION: The signs and symptoms of COVID-19 were discussed with the patient and how to seek care for testing.  The importance of social distancing was discussed today. Hand Washing Techniques and avoid touching face was advised.     MEDICATION ADJUSTMENTS/LABS AND TESTS ORDERED: Continue auto CPAP as prescribed   CURRENT MEDICATIONS REVIEWED AT LENGTH WITH PATIENT TODAY   Patient satisfied with Plan of action and management. All questions answered  Follow up in 1 year   Miguel Jackson [name], M.D.  [facility]  Medical Director [place] Director [facility] Cardio-Pulmonary Department

## 2019-05-05 ENCOUNTER — Ambulatory Visit (INDEPENDENT_AMBULATORY_CARE_PROVIDER_SITE_OTHER): Payer: BC Managed Care – PPO

## 2019-05-05 DIAGNOSIS — Z23 Encounter for immunization: Secondary | ICD-10-CM

## 2022-02-03 ENCOUNTER — Encounter: Payer: Self-pay | Admitting: Gastroenterology

## 2022-02-12 ENCOUNTER — Encounter: Payer: Self-pay | Admitting: Gastroenterology

## 2022-02-28 ENCOUNTER — Ambulatory Visit (AMBULATORY_SURGERY_CENTER): Payer: Self-pay | Admitting: *Deleted

## 2022-02-28 VITALS — Ht 69.0 in | Wt 178.2 lb

## 2022-02-28 DIAGNOSIS — Z86010 Personal history of colonic polyps: Secondary | ICD-10-CM

## 2022-02-28 MED ORDER — NA SULFATE-K SULFATE-MG SULF 17.5-3.13-1.6 GM/177ML PO SOLN: 1.0000 | Freq: Once | ORAL | 0 refills | Status: AC

## 2022-02-28 NOTE — Progress Notes (Signed)
No egg or soy allergy known to patient  No issues known to pt with past sedation with any surgeries or procedures Patient denies ever being told they had issues or difficulty with intubation  No FH of Malignant Hyperthermia Pt is not on diet pills Pt is not on home 02  Pt is not on blood thinners  Pt denies issues with constipation  No A fib or A flutter Have any cardiac testing pending--NO Pt instructed to use [email or web address] or GoodRx for a price reduction on prep   

## 2022-03-24 ENCOUNTER — Encounter: Payer: Self-pay | Admitting: Gastroenterology

## 2022-03-31 ENCOUNTER — Telehealth: Payer: Self-pay | Admitting: Gastroenterology

## 2022-03-31 NOTE — Telephone Encounter (Signed)
Inbound call from patient stating that he Is scheduled to have a colonoscopy with Dr. Loletha Carrow on 10/16 at 9:30 and is seeking advice if he can still have procedure due to testing positive for COVID on 10/6. Patient is requesting a call back to discuss. Please advise.

## 2022-03-31 NOTE — Telephone Encounter (Signed)
Patient returned call and was rescheduled for his procedure on 10/31 at 10:30

## 2022-03-31 NOTE — Telephone Encounter (Signed)
Returned call - voicemail. Left message only to return call. [Pt should reschedule procedure for the week of 10/30 if all symptoms have resolved by the 16th, or later if some symptoms persist. Recommendations suggest symptom reassessment at 10days after positive test, which would be too soon to proceed with procedure that day]

## 2022-03-31 NOTE — Telephone Encounter (Signed)
Updated Suprep instructions sent via MyChart and mail with updated date/times.

## 2022-04-07 ENCOUNTER — Encounter: Payer: BC Managed Care – PPO | Admitting: Gastroenterology

## 2022-04-17 ENCOUNTER — Encounter: Payer: Self-pay | Admitting: Certified Registered Nurse Anesthetist

## 2022-04-22 ENCOUNTER — Ambulatory Visit (AMBULATORY_SURGERY_CENTER): Payer: No Typology Code available for payment source | Admitting: Gastroenterology

## 2022-04-22 ENCOUNTER — Encounter: Payer: Self-pay | Admitting: Gastroenterology

## 2022-04-22 VITALS — BP 132/84 | HR 59 | Temp 96.0°F | Resp 11 | Ht 69.0 in | Wt 178.0 lb

## 2022-04-22 DIAGNOSIS — Z09 Encounter for follow-up examination after completed treatment for conditions other than malignant neoplasm: Secondary | ICD-10-CM | POA: Diagnosis present

## 2022-04-22 DIAGNOSIS — Z86010 Personal history of colonic polyps: Secondary | ICD-10-CM

## 2022-04-22 MED ORDER — SODIUM CHLORIDE 0.9 % IV SOLN: 500.0000 mL | INTRAVENOUS | Status: DC

## 2022-04-22 NOTE — Progress Notes (Signed)
Report given to PACU, vss 

## 2022-04-22 NOTE — Progress Notes (Signed)
History and Physical:  This patient presents for endoscopic testing for: Encounter Diagnosis  Name Primary?   Personal history of colonic polyps Yes    Miguel Jackson is a 51 year old man here for surveillance colonoscopy. Last colonoscopy July 2020 (which was his for screening exam) removed 2 diminutive ascending colon tubular adenomas, a diminutive descending colon SSP, and a 12 mm rectal tubular adenoma.  Patient denies chronic abdominal pain, rectal bleeding, constipation or diarrhea.  Patient is otherwise without complaints or active issues today.   Past Medical History: Past Medical History:  Diagnosis Date   Allergy    SEASONAL "POLLEN"   GERD (gastroesophageal reflux disease)    Sleep apnea    C PAP     Past Surgical History: Past Surgical History:  Procedure Laterality Date   COLONOSCOPY     POLYPECTOMY     SKULL FRACTURE ELEVATION  1991   depressed, needed repair   WISDOM TOOTH EXTRACTION  2019    Allergies: No Known Allergies  Outpatient Meds: Current Outpatient Medications  Medication Sig Dispense Refill   famotidine (PEPCID) 20 MG tablet Take 20 mg by mouth daily. (Patient not taking: Reported on 02/28/2022)     sildenafil (REVATIO) 20 MG tablet Take 3-5 tablets (60-100 mg total) by mouth daily as needed. (Patient not taking: Reported on 02/28/2022) 36 tablet 5   Current Facility-Administered Medications  Medication Dose Route Frequency Provider Last Rate Last Admin   0.9 %  sodium chloride infusion  500 mL Intravenous Continuous Danis, Estill Cotta III, MD          ___________________________________________________________________ Objective   Exam:  BP (!) 157/92   Pulse 64   Temp (!) 96 F (35.6 C) (Temporal)   Resp 17   Ht '5\' 9"'$  (1.753 m)   Wt 178 lb (80.7 kg)   SpO2 99%   BMI 26.29 kg/m   CV: regular , S1/S2 Resp: clear to auscultation bilaterally, normal RR and effort noted GI: soft, no tenderness, with active bowel  sounds.   Assessment: Encounter Diagnosis  Name Primary?   Personal history of colonic polyps Yes     Plan: Colonoscopy  The benefits and risks of the planned procedure were described in detail with the patient or (when appropriate) their health care proxy.  Risks were outlined as including, but not limited to, bleeding, infection, perforation, adverse medication reaction leading to cardiac or pulmonary decompensation, pancreatitis (if ERCP).  The limitation of incomplete mucosal visualization was also discussed.  No guarantees or warranties were given.    The patient is appropriate for an endoscopic procedure in the ambulatory setting.   - Wilfrid Lund, MD

## 2022-04-22 NOTE — Progress Notes (Signed)
Pt's states no medical or surgical changes since previsit or office visit. 

## 2022-04-22 NOTE — Patient Instructions (Signed)
Handout on hemorrhoids provided.  YOU HAD AN ENDOSCOPIC PROCEDURE TODAY AT [facility]:   Refer to the procedure report that was given to you for any specific questions about what was found during the examination.  If the procedure report does not answer your questions, please call your gastroenterologist to clarify.  If you requested that your care partner not be given the details of your procedure findings, then the procedure report has been included in a sealed envelope for you to review at your convenience later.  YOU SHOULD EXPECT: Some feelings of bloating in the abdomen. Passage of more gas than usual.  Walking can help get rid of the air that was put into your GI tract during the procedure and reduce the bloating. If you had a lower endoscopy (such as a colonoscopy or flexible sigmoidoscopy) you may notice spotting of blood in your stool or on the toilet paper. If you underwent a bowel prep for your procedure, you may not have a normal bowel movement for a few days.  Please Note:  You might notice some irritation and congestion in your nose or some drainage.  This is from the oxygen used during your procedure.  There is no need for concern and it should clear up in a day or so.  SYMPTOMS TO REPORT IMMEDIATELY:  Following lower endoscopy (colonoscopy or flexible sigmoidoscopy):  Excessive amounts of blood in the stool  Significant tenderness or worsening of abdominal pains  Swelling of the abdomen that is new, acute  Fever of 100F or higher   For urgent or emergent issues, a gastroenterologist can be reached at any hour by calling [phone]. Do not use MyChart messaging for urgent concerns.    DIET:  We do recommend a small meal at first, but then you may proceed to your regular diet.  Drink plenty of fluids but you should avoid alcoholic beverages for 24 hours.  ACTIVITY:  You should plan to take it easy for the rest of today and you should NOT DRIVE or use heavy  machinery until tomorrow (because of the sedation medicines used during the test).    FOLLOW UP: Our staff will call the number listed on your records the next business day following your procedure.  We will call around 7:15- 8:00 am to check on you and address any questions or concerns that you may have regarding the information given to you following your procedure. If we do not reach you, we will leave a message.     If any biopsies were taken you will be contacted by phone or by letter within the next 1-3 weeks.  Please call us at [phone] if you have not heard about the biopsies in 3 weeks.    SIGNATURES/CONFIDENTIALITY: You and/or your care partner have signed paperwork which will be entered into your electronic medical record.  These signatures attest to the fact that that the information above on your After Visit Summary has been reviewed and is understood.  Full responsibility of the confidentiality of this discharge information lies with you and/or your care-partner.

## 2022-04-22 NOTE — Op Note (Signed)
[facility] Patient Name: [name] Procedure Date: 04/22/2022 10:13 AM MRN: [id number] Endoscopist: [place]. Loletha Carrow , MD, [id number] Age: 51 Referring MD:  Date of Birth: 05-19-1971 Gender: Male Account #: [id number] Procedure:                Colonoscopy Indications:              Surveillance: Personal history of adenomatous                            polyps on last colonoscopy > 3 years ago                           July 2020- <19m TA x 2 in [place] <137mSSP in [place]                           1254mectal TA Medicines:                Monitored Anesthesia Care Procedure:                Pre-Anesthesia Assessment:                           - Prior to the procedure, a History and Physical                            was performed, and patient medications and                            allergies were reviewed. The patient's tolerance of                            previous anesthesia was also reviewed. The risks                            and benefits of the procedure and the sedation                            options and risks were discussed with the patient.                            All questions were answered, and informed consent                            was obtained. Prior Anticoagulants: The patient has                            taken no anticoagulant or antiplatelet agents. ASA                            Grade Assessment: II - A patient with mild systemic                            disease. After reviewing the risks and benefits,  the patient was deemed in satisfactory condition to                            undergo the procedure.                           After obtaining informed consent, the colonoscope                            was passed under direct vision. Throughout the                            procedure, the patient's blood pressure, pulse, and                            oxygen saturations were monitored continuously. The                             CF [id number] #[id number] was introduced through the anus                            and advanced to the the cecum, identified by                            appendiceal orifice and ileocecal valve. The                            colonoscopy was performed without difficulty. The                            patient tolerated the procedure well. The quality                            of the bowel preparation was excellent. The                            ileocecal valve, appendiceal orifice, and rectum                            were photographed. The bowel preparation used was                            SUPREP. Scope In: 10:22:53 AM Scope Out: 10:33:14 AM Scope Withdrawal Time: 0 hours 8 minutes 24 seconds  Total Procedure Duration: 0 hours 10 minutes 21 seconds  Findings:                 The perianal and digital rectal examinations were                            normal.                           Repeat examination of right colon under NBI  performed.                           There is no endoscopic evidence of polyps in the                            entire colon.                           Internal hemorrhoids were found. The hemorrhoids                            were small.                           The exam was otherwise without abnormality on                            direct and retroflexion views. Complications:            No immediate complications. Estimated Blood Loss:     Estimated blood loss: none. Impression:               - Internal hemorrhoids.                           - The examination was otherwise normal on direct                            and retroflexion views.                           - No specimens collected. Recommendation:           - Patient has a contact number available for                            emergencies. The signs and symptoms of potential                            delayed complications were discussed  with the                            patient. Return to normal activities tomorrow.                            Written discharge instructions were provided to the                            patient.                           - Resume previous diet.                           - Continue present medications.                           - Repeat colonoscopy in  5 years for surveillance. Mykira Hofmeister L. Loletha Carrow, MD 04/22/2022 10:37:07 AM This report has been signed electronically.

## 2022-04-23 ENCOUNTER — Telehealth: Payer: Self-pay

## 2022-04-23 NOTE — Telephone Encounter (Signed)
  Follow up Call-     04/22/2022    9:57 AM  Call back number  Post procedure Call Back phone  # [phone]  Permission to leave phone message Yes     Patient questions:  Do you have a fever, pain , or abdominal swelling? No. Pain Score  0 *  Have you tolerated food without any problems? Yes.    Have you been able to return to your normal activities? Yes.    Do you have any questions about your discharge instructions: Diet   No. Medications  No. Follow up visit  No.  Do you have questions or concerns about your Care? No.  Actions: * If pain score is 4 or above: No action needed, pain <4.

## 2023-07-29 ENCOUNTER — Ambulatory Visit: Payer: No Typology Code available for payment source | Admitting: Nurse Practitioner

## 2023-07-29 ENCOUNTER — Encounter: Payer: Self-pay | Admitting: Nurse Practitioner

## 2023-07-29 VITALS — BP 134/64 | HR 64 | Temp 98.0°F | Ht 69.69 in | Wt 187.0 lb

## 2023-07-29 DIAGNOSIS — Z13220 Encounter for screening for lipoid disorders: Secondary | ICD-10-CM

## 2023-07-29 DIAGNOSIS — Z131 Encounter for screening for diabetes mellitus: Secondary | ICD-10-CM

## 2023-07-29 DIAGNOSIS — Z125 Encounter for screening for malignant neoplasm of prostate: Secondary | ICD-10-CM

## 2023-07-29 DIAGNOSIS — Z23 Encounter for immunization: Secondary | ICD-10-CM

## 2023-07-29 DIAGNOSIS — E663 Overweight: Secondary | ICD-10-CM

## 2023-07-29 DIAGNOSIS — Z0000 Encounter for general adult medical examination without abnormal findings: Secondary | ICD-10-CM | POA: Diagnosis not present

## 2023-07-29 DIAGNOSIS — Z8669 Personal history of other diseases of the nervous system and sense organs: Secondary | ICD-10-CM | POA: Insufficient documentation

## 2023-07-29 LAB — COMPREHENSIVE METABOLIC PANEL
ALT: 25 U/L (ref 0–53)
AST: 17 U/L (ref 0–37)
Albumin: 4.6 g/dL (ref 3.5–5.2)
Alkaline Phosphatase: 79 U/L (ref 39–117)
BUN: 20 mg/dL (ref 6–23)
CO2: 30 meq/L (ref 19–32)
Calcium: 9.1 mg/dL (ref 8.4–10.5)
Chloride: 103 meq/L (ref 96–112)
Creatinine, Ser: 1.38 mg/dL (ref 0.40–1.50)
GFR: 57.74 mL/min — ABNORMAL LOW (ref >60.00)
Glucose, Bld: 94 mg/dL (ref 70–99)
Potassium: 3.9 meq/L (ref 3.5–5.1)
Sodium: 141 meq/L (ref 135–145)
Total Bilirubin: 0.7 mg/dL (ref 0.2–1.2)
Total Protein: 6.7 g/dL (ref 6.0–8.3)

## 2023-07-29 LAB — LIPID PANEL
Cholesterol: 219 mg/dL — ABNORMAL HIGH (ref 0–200)
HDL: 39.6 mg/dL (ref >39.00)
LDL Cholesterol: 158 mg/dL — ABNORMAL HIGH (ref 0–99)
NonHDL: 179.67
Total CHOL/HDL Ratio: 6
Triglycerides: 109 mg/dL (ref 0.0–149.0)
VLDL: 21.8 mg/dL (ref 0.0–40.0)

## 2023-07-29 LAB — CBC
HCT: 46.1 % (ref 39.0–52.0)
Hemoglobin: 16 g/dL (ref 13.0–17.0)
MCHC: 34.6 g/dL (ref 30.0–36.0)
MCV: 86.3 fL (ref 78.0–100.0)
Platelets: 232 10*3/uL (ref 150.0–400.0)
RBC: 5.34 Mil/uL (ref 4.22–5.81)
RDW: 12.5 % (ref 11.5–15.5)
WBC: 6.2 10*3/uL (ref 4.0–10.5)

## 2023-07-29 LAB — PSA: PSA: 1.29 ng/mL (ref 0.10–4.00)

## 2023-07-29 LAB — HEMOGLOBIN A1C: Hgb A1c MFr Bld: 5.3 % (ref 4.6–6.5)

## 2023-07-29 LAB — TSH: TSH: 0.8 u[IU]/mL (ref 0.35–5.50)

## 2023-07-29 NOTE — Progress Notes (Signed)
 New Patient Office Visit  Subjective    Patient ID: Miguel Jackson, male    DOB: 1971-01-15  Age: 53 y.o. MRN: [id number]  CC:  Chief Complaint  Patient presents with   Establish Care    Patient is okay with flu shot.    Annual Exam    HPI Miguel Jackson presents to establish care   for complete physical and follow up of chronic conditions.  Immunizations: -Tetanus: Completed in 2020 -Influenza:  update  -Shingles: discussed in office  -Pneumonia: too young   Diet: Fair diet. He will do 3 meals. Breakfast is smaller. 2 cups of coffee. Lunch is chips sandhwich and fruit  Exercise: No regular exercise. Was doing treadmill. He is active and does mountain biking   Eye exam: Completes annually, every 12-18 months  Dental exam: Completes semi-annually.   Colonoscopy: Completed in 04/22/2022, recall  Lung Cancer Screening: NA  PSA: Due  Sleep: goes to bed around 10 and will get up around 530. Feels rested for the most part. Has been told that he snores. He wa susing the CPAP. He has not worn it since he has lost weight      Outpatient Encounter Medications as of 07/29/2023  Medication Sig   sildenafil  (REVATIO ) 20 MG tablet Take 3-5 tablets (60-100 mg total) by mouth daily as needed. (Patient not taking: Reported on 07/29/2023)   [DISCONTINUED] famotidine (PEPCID) 20 MG tablet Take 20 mg by mouth daily. (Patient not taking: Reported on 02/28/2022)   No facility-administered encounter medications on file as of 07/29/2023.    Past Medical History:  Diagnosis Date   Allergy    SEASONAL POLLEN   GERD (gastroesophageal reflux disease)    Sleep apnea    C PAP    Past Surgical History:  Procedure Laterality Date   COLONOSCOPY     POLYPECTOMY     SKULL FRACTURE ELEVATION  1991   depressed, needed repair   WISDOM TOOTH EXTRACTION  2019    Family History  Problem Relation Age of Onset   Diabetes Maternal Grandmother    Cancer Maternal Grandfather         skin and leukemia   Colon cancer Neg Hx    Colon polyps Neg Hx    Esophageal cancer Neg Hx    Rectal cancer Neg Hx    Stomach cancer Neg Hx    Crohn's disease Neg Hx    Ulcerative colitis Neg Hx     Social History   Socioeconomic History   Marital status: Married    Spouse name: Research Scientist (physical Sciences)   Number of children: 2   Years of education: Not on file   Highest education level: Not on file  Occupational History   Occupation: IT    Comment: Printmaker  Tobacco Use   Smoking status: Never    Passive exposure: Never   Smokeless tobacco: Never  Vaping Use   Vaping status: Never Used  Substance and Sexual Activity   Alcohol use: Yes    Comment: socially    Drug use: No   Sexual activity: Not on file  Other Topics Concern   Not on file  Social History Narrative    Fulltime: IT      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  Respiratory:  Negative for shortness of breath.   Cardiovascular:  Negative for chest pain and leg swelling.  Gastrointestinal:  Negative for abdominal pain, blood in stool, constipation, diarrhea, nausea and vomiting.       BM daily   Genitourinary:  Negative for dysuria and hematuria.  Neurological:  Negative for tingling and headaches.  Psychiatric/Behavioral:  Negative for hallucinations and suicidal ideas.         Objective    BP 134/64   Pulse 64   Temp 98 F (36.7 C) (Oral)   Ht 5' 9.69 (1.77 m)   Wt 187 lb (84.8 kg)   SpO2 97%   BMI 27.07 kg/m   Physical Exam Vitals and nursing note reviewed.  Constitutional:      Appearance: Normal appearance.  HENT:     Right Ear: Tympanic membrane, ear canal and external  ear normal.     Left Ear: Tympanic membrane, ear canal and external ear normal.     Mouth/Throat:     Mouth: Mucous membranes are moist.     Pharynx: Oropharynx is clear.  Eyes:     Extraocular Movements: Extraocular movements intact.     Pupils: Pupils are equal, round, and reactive to light.  Cardiovascular:     Rate and Rhythm: Normal rate and regular rhythm.     Pulses: Normal pulses.     Heart sounds: Normal heart sounds.  Pulmonary:     Effort: Pulmonary effort is normal.     Breath sounds: Normal breath sounds.  Abdominal:     General: Bowel sounds are normal. There is no distension.     Palpations: There is no mass.     Tenderness: There is no abdominal tenderness.     Hernia: No hernia is present.  Genitourinary:    Comments: deferred Musculoskeletal:     Right lower leg: No edema.     Left lower leg: No edema.  Lymphadenopathy:     Cervical: No cervical adenopathy.  Skin:    General: Skin is warm.  Neurological:     General: No focal deficit present.     Mental Status: He is alert.     Deep Tendon Reflexes:     Reflex Scores:      Bicep reflexes are 2+ on the right side and 2+ on the left side.      Patellar reflexes are 2+ on the right side and 2+ on the left side.    Comments: Bilateral upper and lower extremity strength 5/5  Psychiatric:        Mood and Affect: Mood normal.        Behavior: Behavior normal.        Thought Content: Thought content normal.        Judgment: Judgment normal.         Assessment & Plan:   Problem List Items Addressed This Visit       Other   Preventative health care - Primary   Discussed age-appropriate immunizations and screening exams.  Did review patient's personal, surgical, social, family histories.  Patient is up-to-date on all age-appropriate vaccinations he would like.  Update flu vaccine today.  Did discuss shingles vaccine in office.  Patient given printout at discharge.  Patient is up-to-date on CRC screening.   Did PSA today for cancer  screening today.  Patient was given information at discharge about preventative healthcare maintenance with anticipatory guidance.      Relevant Orders   CBC   Comprehensive metabolic panel   TSH   Overweight   History of sleep apnea   History of sleep apnea with CPAP use.  Patient states no longer uses it since he lost weight and is only maintains his weight he does not feel like he needs it.  He has not had a repeat sleep study demonstrated an apnea.  Ambulatory referral for at home sleep test pending today.      Relevant Orders   Ambulatory referral to Sleep Studies   Other Visit Diagnoses       Screening for diabetes mellitus       Relevant Orders   Hemoglobin A1c     Screening for lipid disorders       Relevant Orders   Lipid panel     Screening for prostate cancer       Relevant Orders   PSA     Encounter for immunization       Relevant Orders   Flu vaccine trivalent PF, 6mos and older(Flulaval,Afluria,Fluarix,Fluzone) (Completed)       Return in about 1 year (around 07/28/2024) for CPE and Labs.   Adina Crandall, NP

## 2023-07-29 NOTE — Patient Instructions (Signed)
 Nice to see you today I will be in touch with the labs once I have reviewed them Follow up with me in 1 year, sooner if you need me  Consider getting the shingles vaccine We did update your flu vaccine today

## 2023-07-29 NOTE — Assessment & Plan Note (Signed)
 Discussed age-appropriate immunizations and screening exams.  Did review patient's personal, surgical, social, family histories.  Patient is up-to-date on all age-appropriate vaccinations he would like.  Update flu vaccine today.  Did discuss shingles vaccine in office.  Patient given printout at discharge.  Patient is up-to-date on CRC screening.  Did PSA today for cancer screening today.  Patient was given information at discharge about preventative healthcare maintenance with anticipatory guidance.

## 2023-07-29 NOTE — Assessment & Plan Note (Signed)
 History of sleep apnea with CPAP use.  Patient states no longer uses it since he lost weight and is only maintains his weight he does not feel like he needs it.  He has not had a repeat sleep study demonstrated an apnea.  Ambulatory referral for at home sleep test pending today.

## 2023-08-03 ENCOUNTER — Encounter: Payer: Self-pay | Admitting: Nurse Practitioner

## 2023-08-03 ENCOUNTER — Encounter: Payer: Self-pay | Admitting: *Deleted

## 2023-08-03 MED ORDER — SILDENAFIL CITRATE 20 MG PO TABS
60.0000 mg | ORAL_TABLET | Freq: Every day | ORAL | 5 refills | Status: AC | PRN
Start: 2023-08-03 — End: ?

## 2023-08-06 ENCOUNTER — Other Ambulatory Visit (HOSPITAL_COMMUNITY): Payer: Self-pay

## 2023-08-06 ENCOUNTER — Telehealth (HOSPITAL_COMMUNITY): Payer: Self-pay | Admitting: Pharmacy Technician

## 2023-08-06 NOTE — Telephone Encounter (Signed)
Pharmacy Patient Advocate Encounter   Received notification from  [employer]  that prior authorization for SILDENAFIL 100MG  is required/requested.   Insurance verification completed.   The patient is insured through [facility] .   PER INSURANCE SEE COVERAGE CRITERIA BELOW. PT DOES NOT MEET OR HAVE APPROVED INDICATION FOR USE AT THIS TIME.

## 2023-08-31 ENCOUNTER — Other Ambulatory Visit (HOSPITAL_COMMUNITY): Payer: Self-pay

## 2023-09-29 ENCOUNTER — Encounter: Payer: Self-pay | Admitting: Nurse Practitioner

## 2023-09-30 NOTE — Telephone Encounter (Signed)
 Can we reach out to [facility] and see if we can get the sleep study results

## 2023-10-01 ENCOUNTER — Telehealth: Payer: Self-pay | Admitting: Nurse Practitioner

## 2023-10-01 NOTE — Telephone Encounter (Signed)
 Yes he needs to use it. They do make oral appliances and an implantable device. If he wants to discuss those I can send him to pulmonology

## 2023-10-01 NOTE — Telephone Encounter (Signed)
 Can we let the patient know that I got his sleep report. He does have mild sleep apnea. I have sent over orders for a CPAP. He will need an office visit with me in 90 days to evaluated the CPAP therapy

## 2023-10-01 NOTE — Telephone Encounter (Signed)
Printed and placed in your box for review

## 2023-10-01 NOTE — Telephone Encounter (Signed)
 Contacted pt.  Pt states that he already owns a CPAP.  Does not want another one ordered as he said it will become a "insurance nightmare"  Pt states he wanted to know if he needed to use his CPAP again and would like to know if there are any other methods than a CPAP machine.

## 2023-10-02 NOTE — Telephone Encounter (Signed)
 The order does need to be cancelled

## 2023-10-02 NOTE — Telephone Encounter (Signed)
 Ky from [facility] called back about the CPAP order that she push backed via parachute. Based on the patient not wanting the CPAP, Robby Sermon is asking if the order needs to be cancelled? Please follow up at [phone] Option 1. Thank you

## 2023-10-02 NOTE — Telephone Encounter (Signed)
Left detailed message on VM per DPR. 

## 2023-10-02 NOTE — Telephone Encounter (Signed)
 Contacted [facility]. Informed them that the order does need to be canceled.

## 2023-10-08 ENCOUNTER — Other Ambulatory Visit (HOSPITAL_COMMUNITY): Payer: Self-pay

## 2024-02-12 ENCOUNTER — Encounter: Payer: Self-pay | Admitting: Nurse Practitioner
